# Patient Record
Sex: Female | Born: 1970 | Race: White | Hispanic: No | Marital: Married | Smoking: Never smoker
Health system: Southern US, Community
[De-identification: ages and names within clinical notes are randomized; demographics above are authoritative.]

## PROBLEM LIST (undated history)

## (undated) DIAGNOSIS — N200 Calculus of kidney: Secondary | ICD-10-CM

## (undated) DIAGNOSIS — T8859XA Other complications of anesthesia, initial encounter: Secondary | ICD-10-CM

## (undated) DIAGNOSIS — Z8719 Personal history of other diseases of the digestive system: Secondary | ICD-10-CM

## (undated) DIAGNOSIS — K219 Gastro-esophageal reflux disease without esophagitis: Secondary | ICD-10-CM

## (undated) DIAGNOSIS — Z8616 Personal history of COVID-19: Secondary | ICD-10-CM

## (undated) DIAGNOSIS — Z973 Presence of spectacles and contact lenses: Secondary | ICD-10-CM

## (undated) HISTORY — PX: OTHER SURGICAL HISTORY: SHX169

---
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
## 1999-08-19 ENCOUNTER — Encounter: Payer: Self-pay | Admitting: Obstetrics and Gynecology

## 1999-08-19 ENCOUNTER — Ambulatory Visit (HOSPITAL_COMMUNITY): Admission: RE | Admit: 1999-08-19 | Discharge: 1999-08-19 | Payer: Self-pay | Admitting: Obstetrics and Gynecology

## 1999-09-16 ENCOUNTER — Ambulatory Visit (HOSPITAL_COMMUNITY): Admission: RE | Admit: 1999-09-16 | Discharge: 1999-09-16 | Payer: Self-pay | Admitting: Obstetrics and Gynecology

## 1999-09-16 ENCOUNTER — Encounter: Payer: Self-pay | Admitting: Obstetrics and Gynecology

## 1999-10-24 ENCOUNTER — Inpatient Hospital Stay (HOSPITAL_COMMUNITY): Admission: AD | Admit: 1999-10-24 | Discharge: 1999-10-24 | Payer: Self-pay | Admitting: Obstetrics and Gynecology

## 1999-11-01 ENCOUNTER — Ambulatory Visit (HOSPITAL_COMMUNITY): Admission: RE | Admit: 1999-11-01 | Discharge: 1999-11-01 | Payer: Self-pay | Admitting: Obstetrics and Gynecology

## 2000-01-13 ENCOUNTER — Inpatient Hospital Stay (HOSPITAL_COMMUNITY): Admission: AD | Admit: 2000-01-13 | Discharge: 2000-01-15 | Payer: Self-pay | Admitting: Obstetrics and Gynecology

## 2000-03-24 HISTORY — PX: NASAL FRACTURE SURGERY: SHX718

## 2001-08-31 ENCOUNTER — Ambulatory Visit (HOSPITAL_BASED_OUTPATIENT_CLINIC_OR_DEPARTMENT_OTHER): Admission: RE | Admit: 2001-08-31 | Discharge: 2001-08-31 | Payer: Self-pay | Admitting: Otolaryngology

## 2002-03-24 HISTORY — PX: TUBAL LIGATION: SHX77

## 2002-05-16 ENCOUNTER — Ambulatory Visit (HOSPITAL_COMMUNITY): Admission: RE | Admit: 2002-05-16 | Discharge: 2002-05-16 | Payer: Self-pay | Admitting: Obstetrics and Gynecology

## 2002-05-16 ENCOUNTER — Encounter: Payer: Self-pay | Admitting: Obstetrics and Gynecology

## 2002-10-07 ENCOUNTER — Inpatient Hospital Stay (HOSPITAL_COMMUNITY): Admission: AD | Admit: 2002-10-07 | Discharge: 2002-10-09 | Payer: Self-pay | Admitting: Obstetrics and Gynecology

## 2003-05-11 ENCOUNTER — Other Ambulatory Visit: Admission: RE | Admit: 2003-05-11 | Discharge: 2003-05-11 | Payer: Self-pay | Admitting: Obstetrics and Gynecology

## 2004-05-13 ENCOUNTER — Other Ambulatory Visit: Admission: RE | Admit: 2004-05-13 | Discharge: 2004-05-13 | Payer: Self-pay | Admitting: Obstetrics and Gynecology

## 2004-05-31 ENCOUNTER — Ambulatory Visit (HOSPITAL_COMMUNITY): Admission: RE | Admit: 2004-05-31 | Discharge: 2004-05-31 | Payer: Self-pay | Admitting: Obstetrics and Gynecology

## 2005-02-06 ENCOUNTER — Ambulatory Visit: Payer: Self-pay | Admitting: Family Medicine

## 2006-01-14 ENCOUNTER — Ambulatory Visit: Payer: Self-pay | Admitting: Family Medicine

## 2006-03-24 HISTORY — PX: TONSILLECTOMY: SUR1361

## 2007-02-11 ENCOUNTER — Ambulatory Visit: Payer: Self-pay | Admitting: Otolaryngology

## 2010-07-09 ENCOUNTER — Other Ambulatory Visit: Payer: Self-pay | Admitting: Orthopedic Surgery

## 2010-07-09 DIAGNOSIS — R1011 Right upper quadrant pain: Secondary | ICD-10-CM

## 2010-07-17 ENCOUNTER — Ambulatory Visit
Admission: RE | Admit: 2010-07-17 | Discharge: 2010-07-17 | Disposition: A | Discharge status: Home / Self Care | Payer: Managed Care, Other (non HMO) | Source: Ambulatory Visit | Attending: Orthopedic Surgery | Admitting: Orthopedic Surgery

## 2010-07-17 DIAGNOSIS — R1011 Right upper quadrant pain: Secondary | ICD-10-CM

## 2010-07-23 ENCOUNTER — Other Ambulatory Visit (HOSPITAL_COMMUNITY): Payer: Self-pay | Admitting: Orthopedic Surgery

## 2010-07-23 DIAGNOSIS — R52 Pain, unspecified: Secondary | ICD-10-CM

## 2010-07-25 ENCOUNTER — Other Ambulatory Visit: Payer: Self-pay | Admitting: Orthopedic Surgery

## 2010-07-25 ENCOUNTER — Ambulatory Visit
Admission: RE | Admit: 2010-07-25 | Discharge: 2010-07-25 | Disposition: A | Discharge status: Home / Self Care | Payer: Managed Care, Other (non HMO) | Source: Ambulatory Visit | Attending: Orthopedic Surgery | Admitting: Orthopedic Surgery

## 2010-07-25 DIAGNOSIS — R52 Pain, unspecified: Secondary | ICD-10-CM

## 2010-07-29 ENCOUNTER — Encounter (HOSPITAL_COMMUNITY)
Admission: RE | Admit: 2010-07-29 | Discharge: 2010-07-29 | Disposition: A | Discharge status: Home / Self Care | Payer: Managed Care, Other (non HMO) | Source: Ambulatory Visit | Attending: Orthopedic Surgery | Admitting: Orthopedic Surgery

## 2010-07-29 DIAGNOSIS — R072 Precordial pain: Secondary | ICD-10-CM | POA: Insufficient documentation

## 2010-07-29 DIAGNOSIS — R52 Pain, unspecified: Secondary | ICD-10-CM

## 2010-07-29 DIAGNOSIS — R079 Chest pain, unspecified: Secondary | ICD-10-CM | POA: Insufficient documentation

## 2010-07-29 MED ORDER — TECHNETIUM TC 99M MEDRONATE IV KIT
25.0000 | PACK | Freq: Once | INTRAVENOUS | Status: AC | PRN
  Administered 2010-07-29: 25 via INTRAVENOUS

## 2010-07-31 ENCOUNTER — Other Ambulatory Visit: Payer: Self-pay | Admitting: Cardiothoracic Surgery

## 2010-07-31 ENCOUNTER — Encounter (INDEPENDENT_AMBULATORY_CARE_PROVIDER_SITE_OTHER): Payer: Managed Care, Other (non HMO) | Admitting: Cardiothoracic Surgery

## 2010-07-31 DIAGNOSIS — R079 Chest pain, unspecified: Secondary | ICD-10-CM

## 2010-07-31 DIAGNOSIS — R0781 Pleurodynia: Secondary | ICD-10-CM
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
## 2010-08-07 ENCOUNTER — Ambulatory Visit: Payer: Managed Care, Other (non HMO) | Admitting: Cardiothoracic Surgery

## 2010-08-07 ENCOUNTER — Other Ambulatory Visit: Payer: Managed Care, Other (non HMO)

## 2010-08-08 ENCOUNTER — Other Ambulatory Visit: Payer: Managed Care, Other (non HMO)

## 2010-08-08 ENCOUNTER — Ambulatory Visit (INDEPENDENT_AMBULATORY_CARE_PROVIDER_SITE_OTHER): Payer: Managed Care, Other (non HMO) | Admitting: Cardiothoracic Surgery

## 2010-08-08 DIAGNOSIS — R079 Chest pain, unspecified: Secondary | ICD-10-CM

## 2010-08-09 NOTE — Assessment & Plan Note (Signed)
OFFICE VISIT  Theresa Solis, Theresa Solis DOB:  10/07/1970                                        Aug 08, 2010 CHART #:  [id number]  CURRENT PROBLEMS:  The patient returns for followup discussion of her right lower costochondral margin pain, swelling, tenderness, and dysesthesia.  A CT scan was initially planned for today, but was not approved by her insurance company [employer]).  She is still having pain in that area related to her position.  This sounds like neuralgia similar to post thoracotomy pain.  She has had problems with her back over the years and has been treated by a chiropractor.  She states that there is a point in her thoracic spine approximately at T5 or T6 that is manipulated intermittently.  It is the proximal T6 dermatome, which is the location of her symptoms.  For that reason, we will proceed with MRI of the thoracic spine and thorax to look for structural or nerve root etiology of her pain syndrome.  We discussed using Lyrica for treatment of her symptoms, but she is not comfortable starting the medication without full diagnostic evaluation first.  On exam today, her blood pressure 120/80, pulse is 98, saturation 99%. Her breath sounds are clear.  She has no palpable deformities in her ribs on the right, and there is no palpable abnormality in the area of her symptoms.  PLAN:  She will return with results of T-spine - thoracic MRI.  Kerin Perna, M.D. Electronically Signed  PV/MEDQ  D:  08/08/2010  T:  08/09/2010  Job:  [id number]  cc:   G. Dorene Grebe, M.D.

## 2010-08-20 ENCOUNTER — Other Ambulatory Visit: Payer: Self-pay | Admitting: Cardiothoracic Surgery

## 2010-08-20 DIAGNOSIS — R0781 Pleurodynia: Secondary | ICD-10-CM

## 2010-08-23 ENCOUNTER — Ambulatory Visit
Admission: RE | Admit: 2010-08-23 | Discharge: 2010-08-23 | Disposition: A | Discharge status: Home / Self Care | Payer: Managed Care, Other (non HMO) | Source: Ambulatory Visit | Attending: Cardiothoracic Surgery | Admitting: Cardiothoracic Surgery

## 2010-08-23 ENCOUNTER — Ambulatory Visit (INDEPENDENT_AMBULATORY_CARE_PROVIDER_SITE_OTHER): Payer: Managed Care, Other (non HMO) | Admitting: Cardiothoracic Surgery

## 2010-08-23 DIAGNOSIS — R0781 Pleurodynia: Secondary | ICD-10-CM

## 2010-08-23 DIAGNOSIS — G548 Other nerve root and plexus disorders: Secondary | ICD-10-CM

## 2010-08-23 MED ORDER — GADOBENATE DIMEGLUMINE 529 MG/ML IV SOLN
10.0000 mL | Freq: Once | INTRAVENOUS | Status: AC | PRN
  Administered 2010-08-23: 10 mL via INTRAVENOUS

## 2010-08-25 NOTE — Assessment & Plan Note (Signed)
OFFICE VISIT  TALENA, NEIRA DOB:  20-Feb-1971                                        August 25, 2010 CHART #:  [id number]  CURRENT PROBLEMS: 1. Right lower costochondral junction swelling and pain and     dysesthesia. 2. Negative MRI of the abdomen, negative bone scan. 3. No chronic medications or drug allergies.  PRESENT ILLNESS:  Ms. Karam is a 40 year old nonsmoker returns for followup evaluation of her above noted symptoms.  Since her last visit, she underwent a MRI of her thoracic spine due to symptoms of dysesthesia in the T6-T7 dermatome similar to post thoracotomy type symptoms.  The MRI of her thoracic spine with and without contrast shows no abnormal costochondral junctions, no abnormalities of the thoracic spine, and no evidence of thoracic spinal, neuroforaminal stenosis.  The thoracic intervertebral disk morphology is normal as well.  PHYSICAL EXAMINATION:  VITAL SIGNS:  Blood pressure is 99/60, pulse 83, respirations 16, saturation 98%. LUNGS:  Breath sounds are clear. CARDIAC:  Rhythm is regular and she has no palpable abnormalities of her thorax.  RECOMMENDATIONS:  Discussed empiric medication such as Neurontin or Cymbalta for possible fibromyalgia.  She does not wish to start medication as these symptoms are only intermittent and short lasting. We discussed potential referral to a pain management physician Dr. Wynn Banker but she again is not willing to entertain injections or topical Lidoderm therapy for intermittent short-acting symptoms.  We also discussed the possibility of a GI evaluation by gastroenterologist which would be up to the discretion of her primary physician.  Otherwise she will return here as needed.  Kerin Perna, M.D. Electronically Signed  PV/MEDQ  D:  08/25/2010  T:  08/25/2010  Job:  [id number]

## 2012-03-30 ENCOUNTER — Other Ambulatory Visit: Payer: Self-pay | Admitting: Dermatology

## 2012-06-08 ENCOUNTER — Other Ambulatory Visit: Payer: Self-pay

## 2012-07-28 ENCOUNTER — Ambulatory Visit
Admission: RE | Admit: 2012-07-28 | Discharge: 2012-07-28 | Disposition: A | Discharge status: Home / Self Care | Payer: Managed Care, Other (non HMO) | Source: Ambulatory Visit

## 2012-07-28 DIAGNOSIS — Z1231 Encounter for screening mammogram for malignant neoplasm of breast: Secondary | ICD-10-CM

## 2012-08-10 ENCOUNTER — Other Ambulatory Visit: Payer: Self-pay | Admitting: Gastroenterology

## 2012-08-10 DIAGNOSIS — R1011 Right upper quadrant pain: Secondary | ICD-10-CM

## 2012-08-17 ENCOUNTER — Ambulatory Visit
Admission: RE | Admit: 2012-08-17 | Discharge: 2012-08-17 | Disposition: A | Discharge status: Home / Self Care | Payer: Managed Care, Other (non HMO) | Source: Ambulatory Visit | Attending: Gastroenterology | Admitting: Gastroenterology

## 2012-08-17 DIAGNOSIS — R1011 Right upper quadrant pain: Secondary | ICD-10-CM

## 2014-04-12 ENCOUNTER — Other Ambulatory Visit: Payer: Self-pay | Admitting: Internal Medicine

## 2014-04-12 DIAGNOSIS — K2100 Gastro-esophageal reflux disease with esophagitis, without bleeding: Secondary | ICD-10-CM

## 2014-04-13 ENCOUNTER — Ambulatory Visit
Admission: RE | Admit: 2014-04-13 | Discharge: 2014-04-13 | Disposition: A | Discharge status: Home / Self Care | Payer: Managed Care, Other (non HMO) | Source: Ambulatory Visit | Attending: Internal Medicine | Admitting: Internal Medicine

## 2014-04-13 DIAGNOSIS — K2100 Gastro-esophageal reflux disease with esophagitis, without bleeding: Secondary | ICD-10-CM

## 2014-06-04 ENCOUNTER — Encounter (HOSPITAL_COMMUNITY): Payer: Self-pay | Admitting: *Deleted

## 2014-06-04 ENCOUNTER — Emergency Department (HOSPITAL_COMMUNITY): Payer: Managed Care, Other (non HMO)

## 2014-06-04 ENCOUNTER — Emergency Department (HOSPITAL_COMMUNITY)
Admission: EM | Admit: 2014-06-04 | Discharge: 2014-06-05 | Disposition: A | Discharge status: Home / Self Care | Payer: Managed Care, Other (non HMO) | Attending: Emergency Medicine | Admitting: Emergency Medicine

## 2014-06-04 DIAGNOSIS — N201 Calculus of ureter: Secondary | ICD-10-CM | POA: Diagnosis not present

## 2014-06-04 DIAGNOSIS — Z3202 Encounter for pregnancy test, result negative: Secondary | ICD-10-CM | POA: Insufficient documentation

## 2014-06-04 DIAGNOSIS — R1031 Right lower quadrant pain: Secondary | ICD-10-CM | POA: Diagnosis present

## 2014-06-04 DIAGNOSIS — R109 Unspecified abdominal pain: Secondary | ICD-10-CM

## 2014-06-04 LAB — CBC WITH DIFFERENTIAL/PLATELET
BASOS ABS: 0.1 10*3/uL (ref 0.0–0.1)
Basophils Relative: 1 % (ref 0–1)
Eosinophils Absolute: 0.1 10*3/uL (ref 0.0–0.7)
Eosinophils Relative: 2 % (ref 0–5)
HCT: 35.8 % — ABNORMAL LOW (ref 36.0–46.0)
HEMOGLOBIN: 11.7 g/dL — AB (ref 12.0–15.0)
Lymphocytes Relative: 23 % (ref 12–46)
Lymphs Abs: 1.9 10*3/uL (ref 0.7–4.0)
MCH: 27.5 pg (ref 26.0–34.0)
MCHC: 32.7 g/dL (ref 30.0–36.0)
MCV: 84 fL (ref 78.0–100.0)
MONO ABS: 0.8 10*3/uL (ref 0.1–1.0)
Monocytes Relative: 9 % (ref 3–12)
NEUTROS PCT: 65 % (ref 43–77)
Neutro Abs: 5.5 10*3/uL (ref 1.7–7.7)
PLATELETS: 243 10*3/uL (ref 150–400)
RBC: 4.26 MIL/uL (ref 3.87–5.11)
RDW: 14 % (ref 11.5–15.5)
WBC: 8.5 10*3/uL (ref 4.0–10.5)

## 2014-06-04 LAB — URINALYSIS, ROUTINE W REFLEX MICROSCOPIC
BILIRUBIN URINE: NEGATIVE
Glucose, UA: NEGATIVE mg/dL
Ketones, ur: NEGATIVE mg/dL
LEUKOCYTES UA: NEGATIVE
NITRITE: NEGATIVE
PROTEIN: NEGATIVE mg/dL
SPECIFIC GRAVITY, URINE: 1.017 (ref 1.005–1.030)
UROBILINOGEN UA: 0.2 mg/dL (ref 0.0–1.0)
pH: 6 (ref 5.0–8.0)

## 2014-06-04 LAB — COMPREHENSIVE METABOLIC PANEL
ALBUMIN: 4 g/dL (ref 3.5–5.2)
ALK PHOS: 60 U/L (ref 39–117)
ALT: 10 U/L (ref 0–35)
AST: 18 U/L (ref 0–37)
Anion gap: 8 (ref 5–15)
BILIRUBIN TOTAL: 0.4 mg/dL (ref 0.3–1.2)
BUN: 9 mg/dL (ref 6–23)
CALCIUM: 9.5 mg/dL (ref 8.4–10.5)
CO2: 26 mmol/L (ref 19–32)
Chloride: 105 mmol/L (ref 96–112)
Creatinine, Ser: 0.82 mg/dL (ref 0.50–1.10)
GFR, EST NON AFRICAN AMERICAN: 88 mL/min — AB (ref >90)
GLUCOSE: 100 mg/dL — AB (ref 70–99)
POTASSIUM: 4.3 mmol/L (ref 3.5–5.1)
SODIUM: 139 mmol/L (ref 135–145)
Total Protein: 6.9 g/dL (ref 6.0–8.3)

## 2014-06-04 LAB — PREGNANCY, URINE: Preg Test, Ur: NEGATIVE

## 2014-06-04 LAB — URINE MICROSCOPIC-ADD ON

## 2014-06-04 LAB — LIPASE, BLOOD: LIPASE: 25 U/L (ref 11–59)

## 2014-06-04 MED ORDER — ONDANSETRON 4 MG PO TBDP
8.0000 mg | ORAL_TABLET | Freq: Once | ORAL | Status: AC
  Administered 2014-06-04: 8 mg via ORAL
  Filled 2014-06-04: qty 2

## 2014-06-04 MED ORDER — HYDROMORPHONE HCL 1 MG/ML IJ SOLN
1.0000 mg | Freq: Once | INTRAMUSCULAR | Status: AC
  Administered 2014-06-04: 1 mg via INTRAVENOUS
  Filled 2014-06-04: qty 1

## 2014-06-04 MED ORDER — OXYCODONE-ACETAMINOPHEN 5-325 MG PO TABS
1.0000 | ORAL_TABLET | Freq: Once | ORAL | Status: AC
  Administered 2014-06-04: 1 via ORAL
  Filled 2014-06-04: qty 1

## 2014-06-04 MED ORDER — ONDANSETRON HCL 4 MG/2ML IJ SOLN
4.0000 mg | Freq: Once | INTRAMUSCULAR | Status: AC
  Administered 2014-06-04: 4 mg via INTRAVENOUS
  Filled 2014-06-04: qty 2

## 2014-06-04 NOTE — ED Notes (Signed)
The pt is c/o rt l q pain for 1-2 hours with nausea.  lmp  25th feb

## 2014-06-04 NOTE — ED Provider Notes (Signed)
CSN: [id number]     Arrival date & time 06/04/14  1858 History   First MD Initiated Contact with Patient 06/04/14 2236     Chief Complaint  Patient presents with  . Abdominal Pain     (Consider location/radiation/quality/duration/timing/severity/associated sxs/prior Treatment) HPI   The patient is a 44 y/o who presents to the emergency department with right lower quadrant abdominal pain.  Patient states that started suddenly this evening and was accompanied by nausea but no vomiting.  Patient denies diarrhea, chest pain, shortness of breath, weakness, dizziness, headache, blurred vision, dysuria, hematuria, fever or syncope.  The patient states that she did not take any medications prior to arrival.  Nothing seems make her condition better and palpation makes the pain worse  History reviewed. No pertinent past medical history. History reviewed. No pertinent past surgical history. No family history on file. History  Substance Use Topics  . Smoking status: Never Smoker   . Smokeless tobacco: Not on file  . Alcohol Use: Not on file   OB History    No data available     Review of Systems  All other systems negative except as documented in the HPI. All pertinent positives and negatives as reviewed in the HPI.   Allergies  Codeine and Tetracyclines & related  Home Medications   Prior to Admission medications   Not on File   BP 112/80 mmHg  Pulse 88  Temp(Src) 97.7 F (36.5 C)  Resp 18  Ht 5' 8"  (1.727 m)  Wt 150 lb (68.04 kg)  BMI 22.81 kg/m2  SpO2 99%  LMP 05/21/2014 Physical Exam  Constitutional: She is oriented to person, place, and time. She appears well-developed and well-nourished. No distress.  HENT:  Head: Normocephalic and atraumatic.  Mouth/Throat: Oropharynx is clear and moist.  Eyes: Pupils are equal, round, and reactive to light.  Neck: Normal range of motion. Neck supple.  Cardiovascular: Normal rate, regular rhythm and normal heart sounds.  Exam  reveals no gallop and no friction rub.   No murmur heard. Pulmonary/Chest: Effort normal and breath sounds normal. No respiratory distress.  Abdominal: Soft. Normal appearance and bowel sounds are normal. She exhibits no distension. There is tenderness. There is no rigidity, no rebound and no guarding.    Neurological: She is alert and oriented to person, place, and time. She exhibits normal muscle tone. Coordination normal.  Skin: Skin is warm and dry.  Nursing note and vitals reviewed.   ED Course  Procedures (including critical care time) Labs Review Labs Reviewed  CBC WITH DIFFERENTIAL/PLATELET - Abnormal; Notable for the following:    Hemoglobin 11.7 (*)    HCT 35.8 (*)    All other components within normal limits  COMPREHENSIVE METABOLIC PANEL - Abnormal; Notable for the following:    Glucose, Bld 100 (*)    GFR calc non Af Amer 88 (*)    All other components within normal limits  URINALYSIS, ROUTINE W REFLEX MICROSCOPIC - Abnormal; Notable for the following:    Hgb urine dipstick MODERATE (*)    All other components within normal limits  URINE MICROSCOPIC-ADD ON - Abnormal; Notable for the following:    Squamous Epithelial / LPF MANY (*)    Casts GRANULAR CAST (*)    All other components within normal limits  LIPASE, BLOOD  PREGNANCY, URINE    Imaging Review Ct Renal Stone Study  06/04/2014   CLINICAL DATA:  Right flank pain, nausea and vomiting  EXAM: CT ABDOMEN AND PELVIS  WITHOUT CONTRAST  TECHNIQUE: Multidetector CT imaging of the abdomen and pelvis was performed following the standard protocol without IV contrast.  COMPARISON:  None.  FINDINGS: There is an obstructing 2.5 mm right ureteral calculus at the upper L4 level with proximal ureterectasis and hydronephrosis. Collecting system calculi are present bilaterally, measuring up to 3 mm in the right upper pole and 4 mm in the left upper pole.  No other acute findings are evident in the abdomen or pelvis. There are  otherwise unremarkable unenhanced appearances of the abdomen and pelvis. No significant abnormality is evident in the lower chest. No significant musculoskeletal abnormality is evident.  IMPRESSION: Obstructing 2.5 mm right ureteral calculus at the upper L4 level with moderate hydronephrosis. Bilateral nephrolithiasis.   Electronically Signed   By: Andreas Newport M.D.   On: 06/04/2014 23:45    Patient has a ureteral stone.  She will be given pain control and advised follow-up with urology.  Patient is feeling improved following pain medications.  Told to increase her fluid intake and rest as much as possible.  Told to return here for worsening in her condition  MDM   Final diagnoses:  RLQ abdominal pain  Abdominal pain        Dalia Heading, PA-C 06/05/14 8682  Serita Grit, MD 06/08/14 [phone]

## 2014-06-05 MED ORDER — PROMETHAZINE HCL 25 MG PO TABS: 25.0000 mg | ORAL_TABLET | Freq: Three times a day (TID) | ORAL | Status: DC | PRN

## 2014-06-05 MED ORDER — KETOROLAC TROMETHAMINE 30 MG/ML IJ SOLN
30.0000 mg | Freq: Once | INTRAMUSCULAR | Status: AC
  Administered 2014-06-05: 30 mg via INTRAVENOUS
  Filled 2014-06-05: qty 1

## 2014-06-05 MED ORDER — HYDROMORPHONE HCL 1 MG/ML IJ SOLN
1.0000 mg | Freq: Once | INTRAMUSCULAR | Status: AC
  Administered 2014-06-05: 1 mg via INTRAVENOUS
  Filled 2014-06-05: qty 1

## 2014-06-05 MED ORDER — OXYCODONE-ACETAMINOPHEN 5-325 MG PO TABS: 1.0000 | ORAL_TABLET | Freq: Four times a day (QID) | ORAL | Status: DC | PRN

## 2014-06-05 NOTE — Discharge Instructions (Signed)
Return here for any worsening in your condition.  Follow-up with the urologist provided.  Increase your fluid intake

## 2014-06-06 ENCOUNTER — Encounter (HOSPITAL_BASED_OUTPATIENT_CLINIC_OR_DEPARTMENT_OTHER): Payer: Self-pay | Admitting: *Deleted

## 2014-06-06 ENCOUNTER — Other Ambulatory Visit: Payer: Self-pay | Admitting: Urology

## 2014-06-06 NOTE — Progress Notes (Signed)
NPO AFTER MN WITH EXCEPTION CLEAR LIQUIDS UNTIL 0800 (NO CREAM/ MILK PRODUCTS).  ARRIVE AT 1230. CURRENT LAB RESULTS IN CHART AND EPIC. WILL TAKE FLOMAX AND COLACE AM DOS W/ SIPS OF WATER AND IF NEEDED MAY TAKE PERCOCET/ PHENERGAN.

## 2014-06-07 NOTE — Anesthesia Preprocedure Evaluation (Addendum)
Anesthesia Evaluation  Patient identified by MRN, date of birth, ID band Patient awake    Reviewed: Allergy & Precautions, NPO status , Patient's Chart, lab work & pertinent test results  History of Anesthesia Complications Negative for: history of anesthetic complications  Airway Mallampati: II  TM Distance: >3 FB Neck ROM: Full    Dental no notable dental hx. (+) Dental Advisory Given   Pulmonary neg pulmonary ROS,  breath sounds clear to auscultation  Pulmonary exam normal       Cardiovascular negative cardio ROS  Rhythm:Regular Rate:Normal     Neuro/Psych negative neurological ROS  negative psych ROS   GI/Hepatic Neg liver ROS, GERD-  ,  Endo/Other  negative endocrine ROS  Renal/GU Renal disease  negative genitourinary   Musculoskeletal negative musculoskeletal ROS (+)   Abdominal   Peds negative pediatric ROS (+)  Hematology  (+) anemia ,   Anesthesia Other Findings   Reproductive/Obstetrics negative OB ROS                             Anesthesia Physical Anesthesia Plan  ASA: II  Anesthesia Plan: General   Post-op Pain Management:    Induction: Intravenous  Airway Management Planned: LMA  Additional Equipment:   Intra-op Plan:   Post-operative Plan: Extubation in OR  Informed Consent: I have reviewed the patients History and Physical, chart, labs and discussed the procedure including the risks, benefits and alternatives for the proposed anesthesia with the patient or authorized representative who has indicated his/her understanding and acceptance.   Dental advisory given  Plan Discussed with: CRNA  Anesthesia Plan Comments:         Anesthesia Quick Evaluation

## 2014-06-08 ENCOUNTER — Ambulatory Visit (HOSPITAL_BASED_OUTPATIENT_CLINIC_OR_DEPARTMENT_OTHER): Payer: Managed Care, Other (non HMO) | Admitting: Anesthesiology

## 2014-06-08 ENCOUNTER — Ambulatory Visit (HOSPITAL_BASED_OUTPATIENT_CLINIC_OR_DEPARTMENT_OTHER)
Admission: RE | Admit: 2014-06-08 | Discharge: 2014-06-08 | Disposition: A | Discharge status: Home / Self Care | Payer: Managed Care, Other (non HMO) | Source: Ambulatory Visit | Attending: Urology | Admitting: Urology

## 2014-06-08 ENCOUNTER — Encounter (HOSPITAL_BASED_OUTPATIENT_CLINIC_OR_DEPARTMENT_OTHER)
Admission: RE | Disposition: A | Discharge status: Home / Self Care | Payer: Self-pay | Source: Ambulatory Visit | Attending: Urology

## 2014-06-08 ENCOUNTER — Encounter (HOSPITAL_BASED_OUTPATIENT_CLINIC_OR_DEPARTMENT_OTHER): Payer: Self-pay

## 2014-06-08 DIAGNOSIS — D649 Anemia, unspecified: Secondary | ICD-10-CM | POA: Diagnosis not present

## 2014-06-08 DIAGNOSIS — N200 Calculus of kidney: Secondary | ICD-10-CM | POA: Diagnosis present

## 2014-06-08 DIAGNOSIS — K219 Gastro-esophageal reflux disease without esophagitis: Secondary | ICD-10-CM | POA: Insufficient documentation

## 2014-06-08 DIAGNOSIS — Z886 Allergy status to analgesic agent status: Secondary | ICD-10-CM | POA: Diagnosis not present

## 2014-06-08 DIAGNOSIS — Z79899 Other long term (current) drug therapy: Secondary | ICD-10-CM | POA: Diagnosis not present

## 2014-06-08 DIAGNOSIS — Z9851 Tubal ligation status: Secondary | ICD-10-CM | POA: Insufficient documentation

## 2014-06-08 HISTORY — DX: Presence of spectacles and contact lenses: Z97.3

## 2014-06-08 HISTORY — PX: CYSTOSCOPY WITH RETROGRADE PYELOGRAM, URETEROSCOPY AND STENT PLACEMENT: SHX5789

## 2014-06-08 HISTORY — PX: HOLMIUM LASER APPLICATION: SHX5852

## 2014-06-08 HISTORY — DX: Calculus of kidney: N20.0

## 2014-06-08 HISTORY — DX: Personal history of other diseases of the digestive system: Z87.19

## 2014-06-08 LAB — POCT I-STAT, CHEM 8
BUN: 7 mg/dL (ref 6–23)
CHLORIDE: 105 mmol/L (ref 96–112)
Calcium, Ion: 1.25 mmol/L — ABNORMAL HIGH (ref 1.12–1.23)
Creatinine, Ser: 0.7 mg/dL (ref 0.50–1.10)
GLUCOSE: 94 mg/dL (ref 70–99)
HCT: 37 % (ref 36.0–46.0)
Hemoglobin: 12.6 g/dL (ref 12.0–15.0)
POTASSIUM: 3.9 mmol/L (ref 3.5–5.1)
SODIUM: 143 mmol/L (ref 135–145)
TCO2: 24 mmol/L (ref 0–100)

## 2014-06-08 SURGERY — CYSTOURETEROSCOPY, WITH RETROGRADE PYELOGRAM AND STENT INSERTION
Anesthesia: General | Site: Ureter | Laterality: Left

## 2014-06-08 MED ORDER — SENNOSIDES-DOCUSATE SODIUM 8.6-50 MG PO TABS: 1.0000 | ORAL_TABLET | Freq: Two times a day (BID) | ORAL | Status: DC

## 2014-06-08 MED ORDER — SODIUM CHLORIDE 0.9 % IR SOLN
Status: DC | PRN
  Administered 2014-06-08: 6500 mL

## 2014-06-08 MED ORDER — GENTAMICIN IN SALINE 1.6-0.9 MG/ML-% IV SOLN
80.0000 mg | INTRAVENOUS | Status: DC
  Filled 2014-06-08: qty 50

## 2014-06-08 MED ORDER — GENTAMICIN SULFATE 40 MG/ML IJ SOLN
5.0000 mg/kg | Freq: Once | INTRAVENOUS | Status: AC
  Administered 2014-06-08: 330 mg via INTRAVENOUS
  Filled 2014-06-08: qty 8.25

## 2014-06-08 MED ORDER — OXYCODONE HCL 5 MG PO TABS
5.0000 mg | ORAL_TABLET | Freq: Once | ORAL | Status: AC
  Administered 2014-06-08: 5 mg via ORAL
  Filled 2014-06-08: qty 1

## 2014-06-08 MED ORDER — FENTANYL CITRATE 0.05 MG/ML IJ SOLN
INTRAMUSCULAR | Status: DC | PRN
  Administered 2014-06-08: 25 ug via INTRAVENOUS
  Administered 2014-06-08: 50 ug via INTRAVENOUS
  Administered 2014-06-08: 25 ug via INTRAVENOUS
  Administered 2014-06-08: 50 ug via INTRAVENOUS

## 2014-06-08 MED ORDER — ACETAMINOPHEN 10 MG/ML IV SOLN
INTRAVENOUS | Status: DC | PRN
  Administered 2014-06-08: 1000 mg via INTRAVENOUS

## 2014-06-08 MED ORDER — METOCLOPRAMIDE HCL 5 MG/ML IJ SOLN
INTRAMUSCULAR | Status: DC | PRN
  Administered 2014-06-08: 10 mg via INTRAVENOUS

## 2014-06-08 MED ORDER — LIDOCAINE HCL (CARDIAC) 20 MG/ML IV SOLN
INTRAVENOUS | Status: DC | PRN
  Administered 2014-06-08: 100 mg via INTRAVENOUS

## 2014-06-08 MED ORDER — OXYCODONE-ACETAMINOPHEN 5-325 MG PO TABS: 1.0000 | ORAL_TABLET | Freq: Four times a day (QID) | ORAL | Status: DC | PRN

## 2014-06-08 MED ORDER — IOHEXOL 350 MG/ML SOLN
INTRAVENOUS | Status: DC | PRN
  Administered 2014-06-08: 18 mL

## 2014-06-08 MED ORDER — MIDAZOLAM HCL 2 MG/2ML IJ SOLN
INTRAMUSCULAR | Status: AC
  Filled 2014-06-08: qty 2

## 2014-06-08 MED ORDER — FENTANYL CITRATE 0.05 MG/ML IJ SOLN
25.0000 ug | INTRAMUSCULAR | Status: DC | PRN
  Administered 2014-06-08 (×2): 25 ug via INTRAVENOUS
  Filled 2014-06-08: qty 1

## 2014-06-08 MED ORDER — OXYCODONE HCL 5 MG PO TABS
ORAL_TABLET | ORAL | Status: AC
  Filled 2014-06-08: qty 1

## 2014-06-08 MED ORDER — LACTATED RINGERS IV SOLN
INTRAVENOUS | Status: DC
  Administered 2014-06-08 (×2): via INTRAVENOUS
  Filled 2014-06-08: qty 1000

## 2014-06-08 MED ORDER — MIDAZOLAM HCL 5 MG/5ML IJ SOLN
INTRAMUSCULAR | Status: DC | PRN
  Administered 2014-06-08: 2 mg via INTRAVENOUS

## 2014-06-08 MED ORDER — ONDANSETRON HCL 4 MG/2ML IJ SOLN
4.0000 mg | Freq: Once | INTRAMUSCULAR | Status: DC | PRN
  Filled 2014-06-08: qty 2

## 2014-06-08 MED ORDER — CEPHALEXIN 500 MG PO CAPS: 500.0000 mg | ORAL_CAPSULE | Freq: Two times a day (BID) | ORAL | Status: DC

## 2014-06-08 MED ORDER — DEXAMETHASONE SODIUM PHOSPHATE 4 MG/ML IJ SOLN
INTRAMUSCULAR | Status: DC | PRN
  Administered 2014-06-08: 10 mg via INTRAVENOUS

## 2014-06-08 MED ORDER — PROPOFOL 10 MG/ML IV BOLUS
INTRAVENOUS | Status: DC | PRN
  Administered 2014-06-08: 200 mg via INTRAVENOUS

## 2014-06-08 MED ORDER — KETOROLAC TROMETHAMINE 30 MG/ML IJ SOLN
INTRAMUSCULAR | Status: DC | PRN
  Administered 2014-06-08: 30 mg via INTRAVENOUS

## 2014-06-08 MED ORDER — FENTANYL CITRATE 0.05 MG/ML IJ SOLN
INTRAMUSCULAR | Status: AC
  Filled 2014-06-08: qty 4

## 2014-06-08 MED ORDER — ONDANSETRON HCL 4 MG/2ML IJ SOLN
INTRAMUSCULAR | Status: DC | PRN
  Administered 2014-06-08: 4 mg via INTRAVENOUS

## 2014-06-08 MED ORDER — FENTANYL CITRATE 0.05 MG/ML IJ SOLN
INTRAMUSCULAR | Status: AC
  Filled 2014-06-08: qty 2

## 2014-06-08 SURGICAL SUPPLY — 25 items
ADAPTER CATH URET PLST 4-6FR (CATHETERS) ×4 IMPLANT
BAG DRAIN URO-CYSTO SKYTR STRL (DRAIN) ×4 IMPLANT
BASKET LASER NITINOL 1.9FR (BASKET) ×4 IMPLANT
CANISTER SUCT LVC 12 LTR MEDI- (MISCELLANEOUS) ×4 IMPLANT
CATH INTERMIT  6FR 70CM (CATHETERS) ×4 IMPLANT
CLOTH BEACON ORANGE TIMEOUT ST (SAFETY) ×4 IMPLANT
FIBER LASER FLEXIVA 200 (UROLOGICAL SUPPLIES) IMPLANT
FIBER LASER FLEXIVA 365 (UROLOGICAL SUPPLIES) IMPLANT
FIBER LASER TRAC TIP (UROLOGICAL SUPPLIES) ×4 IMPLANT
GLOVE BIO SURGEON STRL SZ 6.5 (GLOVE) ×3 IMPLANT
GLOVE BIO SURGEON STRL SZ7.5 (GLOVE) ×4 IMPLANT
GLOVE BIO SURGEONS STRL SZ 6.5 (GLOVE) ×1
GLOVE BIOGEL PI IND STRL 6.5 (GLOVE) ×4 IMPLANT
GLOVE BIOGEL PI INDICATOR 6.5 (GLOVE) ×4
GOWN STRL REUS W/ TWL XL LVL3 (GOWN DISPOSABLE) ×2 IMPLANT
GOWN STRL REUS W/TWL XL LVL3 (GOWN DISPOSABLE) ×6 IMPLANT
GUIDEWIRE ANG ZIPWIRE 038X150 (WIRE) ×8 IMPLANT
GUIDEWIRE STR DUAL SENSOR (WIRE) ×4 IMPLANT
IV NS IRRIG 3000ML ARTHROMATIC (IV SOLUTION) ×8 IMPLANT
NS IRRIG 500ML POUR BTL (IV SOLUTION) ×4 IMPLANT
PACK CYSTO (CUSTOM PROCEDURE TRAY) ×4 IMPLANT
SHEATH ACCESS URETERAL 24CM (SHEATH) ×4 IMPLANT
STENT POLARIS 5FRX24 (STENTS) ×4 IMPLANT
SYRINGE 10CC LL (SYRINGE) ×4 IMPLANT
TUBE FEEDING 8FR 16IN STR KANG (MISCELLANEOUS) ×4 IMPLANT

## 2014-06-08 NOTE — Anesthesia Postprocedure Evaluation (Signed)
  Anesthesia Post-op Note  Patient: Theresa Solis  Procedure(s) Performed: Procedure(s) (LRB): CYSTOSCOPY WITH BILATERAL RETROGRADE PYELOGRAM/ BILATERAL URETERAL STENT PLACEMENT/ BILATERAL URETEROSCOPY, LASER LITHOTRIPSY/STONE BASKETRY (Bilateral) HOLMIUM LASER APPLICATION (Left)  Patient Location: PACU  Anesthesia Type: General  Level of Consciousness: awake and alert   Airway and Oxygen Therapy: Patient Spontanous Breathing  Post-op Pain: mild  Post-op Assessment: Post-op Vital signs reviewed, Patient's Cardiovascular Status Stable, Respiratory Function Stable, Patent Airway and No signs of Nausea or vomiting  Last Vitals:  Filed Vitals:   06/08/14 1449  BP: 110/66  Pulse: 94  Temp: 36.5 C  Resp: 17    Post-op Vital Signs: stable   Complications: No apparent anesthesia complications

## 2014-06-08 NOTE — Anesthesia Procedure Notes (Signed)
Procedure Name: LMA Insertion Date/Time: 06/08/2014 1:48 PM Performed by: Mechele Claude Pre-anesthesia Checklist: Patient identified, Emergency Drugs available, Suction available and Patient being monitored Patient Re-evaluated:Patient Re-evaluated prior to inductionOxygen Delivery Method: Circle System Utilized Preoxygenation: Pre-oxygenation with 100% oxygen Intubation Type: IV induction Ventilation: Mask ventilation without difficulty LMA: LMA inserted LMA Size: 4.0 Number of attempts: 1 Airway Equipment and Method: bite block Placement Confirmation: positive ETCO2 Tube secured with: Tape Dental Injury: Teeth and Oropharynx as per pre-operative assessment

## 2014-06-08 NOTE — Discharge Instructions (Signed)
1 - You may have urinary urgency (bladder spasms) and bloody urine on / off with stent in place. This is normal.  2 - Call MD or go to ER for fever >102, severe pain / nausea / vomiting not relieved by medications, or acute change in medical status  3 - Remove tethered stent on Monday morning at home by pulling on string, then blue/white plastic tubing and discarding. There are two stents. Dr. Tresa Moore is in the office Monday if any issues arise.    [facility] [phone] Post Ureteroscopy With or Without Stent Instructions  Definitions:  Ureter: The duct that transports urine from the kidney to the bladder. Stent:   A plastic hollow tube that is placed into the ureter, from the kidney to the                 bladder to prevent the ureter from swelling shut.  GENERAL INSTRUCTIONS:  Despite the fact that no skin incisions were used, the area around the ureter and bladder is raw and irritated. The stent is a foreign body which will further irritate the bladder wall. This irritation is manifested by increased frequency of urination, both day and night, and by an increase in the urge to urinate. In some, the urge to urinate is present almost always. Sometimes the urge is strong enough that you may not be able to stop yourself from urinating. The only real cure is to remove the stent and then give time for the bladder wall to heal which can't be done until the danger of the ureter swelling shut has passed, which varies.  You may see some blood in your urine while the stent is in place and a few days afterwards. Do not be alarmed, even if the urine was clear for a while. Get off your feet and drink lots of fluids until clearing occurs. If you start to pass clots or don't improve, call us.  DIET: You may return to your normal diet immediately. Because of the raw surface of your bladder, alcohol, spicy foods, acid type foods and drinks with caffeine may cause irritation or frequency and  should be used in moderation. To keep your urine flowing freely and to avoid constipation, drink plenty of fluids during the day ( 8-10 glasses ). Tip: Avoid cranberry juice because it is very acidic.  ACTIVITY: Your physical activity doesn't need to be restricted. However, if you are very active, you may see some blood in your urine. We suggest that you reduce your activity under these circumstances until the bleeding has stopped.  BOWELS: It is important to keep your bowels regular during the postoperative period. Straining with bowel movements can cause bleeding. A bowel movement every other day is reasonable. Use a mild laxative if needed, such as Milk of Magnesia 2-3 tablespoons, or 2 Dulcolax tablets. Call if you continue to have problems. If you have been taking narcotics for pain, before, during or after your surgery, you may be constipated. Take a laxative if necessary.   MEDICATION: You should resume your pre-surgery medications unless told not to. In addition you will often be given an antibiotic to prevent infection. These should be taken as prescribed until the bottles are finished unless you are having an unusual reaction to one of the drugs.  PROBLEMS YOU SHOULD REPORT TO [place]:  Fevers over 100.5 Fahrenheit.  Heavy bleeding, or clots ( See above notes about blood in urine ).  Inability to urinate.  Drug reactions (  hives, rash, nausea, vomiting, diarrhea ).  Severe burning or pain with urination that is not improving.  FOLLOW-UP: You will need a follow-up appointment to monitor your progress. Call for this appointment at the number listed above. Usually the first appointment will be about three to fourteen days after your surgery.   Post Anesthesia Home Care Instructions  Activity: Get plenty of rest for the remainder of the day. A responsible adult should stay with you for 24 hours following the procedure.  For the next 24 hours, DO NOT: -Drive a car -Conservation officer, nature -Drink alcoholic beverages -Take any medication unless instructed by your physician -Make any legal decisions or sign important papers.  Meals: Start with liquid foods such as gelatin or soup. Progress to regular foods as tolerated. Avoid greasy, spicy, heavy foods. If nausea and/or vomiting occur, drink only clear liquids until the nausea and/or vomiting subsides. Call your physician if vomiting continues.  Special Instructions/Symptoms: Your throat may feel dry or sore from the anesthesia or the breathing tube placed in your throat during surgery. If this causes discomfort, gargle with warm salt water. The discomfort should disappear within 24 hours.

## 2014-06-08 NOTE — Brief Op Note (Signed)
06/08/2014  2:38 PM  PATIENT:  Army Fossa  44 y.o. female  PRE-OPERATIVE DIAGNOSIS:  BILATERAL NEPHROLITHIASIS  POST-OPERATIVE DIAGNOSIS:  BILATERAL NEPHROLITHIASIS  PROCEDURE:  Procedure(s): CYSTOSCOPY WITH BILATERAL RETROGRADE PYELOGRAM/ BILATERAL URETERAL STENT PLACEMENT/ BILATERAL URETEROSCOPY, LASER LITHOTRIPSY/STONE BASKETRY (Bilateral) HOLMIUM LASER APPLICATION (Left)  SURGEON:  Surgeon(s) and Role:    * Alexis Frock, MD - Primary  PHYSICIAN ASSISTANT:   ASSISTANTS: none   ANESTHESIA:   general  EBL:  Total I/O In: 700 [I.V.:700] Out: -   BLOOD ADMINISTERED:none  DRAINS: none   LOCAL MEDICATIONS USED:  NONE  SPECIMEN:  Source of Specimen:  bilateral urolithiasis  DISPOSITION OF SPECIMEN:  [facility] for compositional analysis  COUNTS:  YES  TOURNIQUET:  * No tourniquets in log *  DICTATION: .Other Dictation: Dictation Number [id number]  PLAN OF CARE: Discharge to home after PACU  PATIENT DISPOSITION:  PACU - hemodynamically stable.   Delay start of Pharmacological VTE agent (>24hrs) due to surgical blood loss or risk of bleeding: yes

## 2014-06-08 NOTE — Transfer of Care (Signed)
Immediate Anesthesia Transfer of Care Note  Patient: Theresa Solis  Procedure(s) Performed: Procedure(s) (LRB): CYSTOSCOPY WITH BILATERAL RETROGRADE PYELOGRAM/ BILATERAL URETERAL STENT PLACEMENT/ BILATERAL URETEROSCOPY, LASER LITHOTRIPSY/STONE BASKETRY (Bilateral) HOLMIUM LASER APPLICATION (Left)  Patient Location: PACU  Anesthesia Type: General  Level of Consciousness: awake, alert  and oriented  Airway & Oxygen Therapy: Patient Spontanous Breathing and Patient connected to nasal cannula oxygen  Post-op Assessment: Report given to PACU RN and Post -op Vital signs reviewed and stable  Post vital signs: Reviewed and stable  Complications: No apparent anesthesia complicationsLast Vitals:  Filed Vitals:   06/08/14 1236  BP: 119/76  Pulse: 82  Temp: 36.9 C  Resp: 16

## 2014-06-08 NOTE — H&P (Signed)
Theresa Solis is an 44 y.o. female.    Chief Complaint: Pre op ureteroscopic stone manipulaiton  HPI:   1 - Nephrolithiasis - Rt 26m mid ureteral with mod hydro (not visible on scout images, adjacent to L3-4 interspace) by ER CT 05/2014. Also left 445mnon-obstructing renal and bilateral punctate stones. She is s/p BTL. Currently managed with percocet and phenergan.   [place] and is very anxious about traveling with colic.   2 - Medical Stone Disease -  Eval 2016: BMP, PTH, Urate - normal; Composition - pending; 24 Hr Urines - pending  PMH sig for ENT surgery. NO CV disease. No blood thinners. Her PCP is Dr. RaAshby Dawes  Today "Theresa Mercuryis seen to proceed with bilateral ureteroscopic stone manipulation with goal of stone free. NO interval fevers.   Past Medical History  Diagnosis Date  . Nephrolithiasis     BILATERAL  . History of gastroesophageal reflux (GERD)   . Wears contact lenses     Past Surgical History  Procedure Laterality Date  . Excision cyst mandible  1989  . Nasal fracture surgery  2002  . Tonsillectomy  2008  . Tubal ligation  2004    PPTL    History reviewed. No pertinent family history. Social History:  reports that she has never smoked. She has never used smokeless tobacco. She reports that she does not drink alcohol or use illicit drugs.  Allergies:  Allergies  Allergen Reactions  . Codeine Nausea And Vomiting  . Tetracyclines & Related Nausea And Vomiting    No prescriptions prior to admission    No results found for this or any previous visit (from the past 48 hour(s)). No results found.  Review of Systems  Constitutional: Negative.   HENT: Negative.   Eyes: Negative.   Respiratory: Negative.   Cardiovascular: Negative.   Gastrointestinal: Negative.   Genitourinary: Positive for hematuria.  Musculoskeletal: Negative.   Skin: Negative.   Neurological: Negative.   Endo/Heme/Allergies:  Negative.   Psychiatric/Behavioral: Negative.     Last menstrual period 05/21/2014. Physical Exam  Constitutional: She appears well-developed.  HENT:  Head: Normocephalic.  Eyes: Pupils are equal, round, and reactive to light.  Neck: Normal range of motion.  Cardiovascular: Normal rate.   Respiratory: Effort normal.  GI: Soft.  Genitourinary:  Mild Rt CVAT  Musculoskeletal: Normal range of motion.  Neurological: She is alert.  Skin: Skin is warm.  Psychiatric: She has a normal mood and affect. Her behavior is normal. Judgment and thought content normal.     Assessment/Plan       1 - Nephrolithiasis -   We rediscussed ureteroscopic stone manipulation with basketing and laser-lithotripsy in detail.  We rediscussed risks including bleeding, infection, damage to kidney / ureter  bladder, rarely loss of kidney. We rediscussed anesthetic risks and rare but serious surgical complications including DVT, PE, MI, and mortality. We specifically readdressed that in 5-10% of cases a staged approach is required with stenting followed by re-attempt ureteroscopy if anatomy unfavorable.   The patient voiced understanding and wises to proceed today as planned.  2 - Medical Stone Disease - Rec eval as multifocal and young age, partial eval completed.   Kyan Giannone 06/08/2014, 8:13 AM

## 2014-06-09 ENCOUNTER — Encounter (HOSPITAL_BASED_OUTPATIENT_CLINIC_OR_DEPARTMENT_OTHER): Payer: Self-pay | Admitting: Urology

## 2014-06-09 NOTE — Op Note (Signed)
Theresa Solis, SCAROLA NO.:  [id number]  MEDICAL RECORD NO.:  [id number]  LOCATION:                                 FACILITY:  PHYSICIAN:  Alexis Frock, MD     DATE OF BIRTH:  26-Oct-1970  DATE OF PROCEDURE:  06/08/2014 DATE OF DISCHARGE:  06/08/2014                              OPERATIVE REPORT   DIAGNOSES:  Right ureteral, bilateral renal stones.  PROCEDURE: 1. Cystoscopy with bilateral retrograde pyelogram interpretation. 2. Bilateral ureteroscopy with laser lithotripsy. 3. Insertion of bilateral ureteral stents, 5 x 24 Polaris with tether.  ESTIMATED BLOOD LOSS:  Nil.  COMPLICATIONS:  None.  SPECIMENS:  Bilateral renal and ureteral stone fragments for compositional analysis.  FINDINGS: 1. Impacted right distal ureteral stone at the ureterovesical     junction. 2. Area of relative narrowing at the UPJ without focal stricture, this     did not allow easy passage of the flexible digital ureteroscope to     the level of the right kidney. 3. Multifocal left renal stones, largest in the upper pole. 4. Complete resolution of all visible stone fragments larger than     1/3rd mm following laser lithotripsy and stone extraction. 5. Placement of bilateral ureteral stents proximal in renal pelvis and     distal in urinary bladder.  INDICATION:  Theresa Solis is a pleasant 44 year old lady with recent history of superior right colicky flank pain.  She was found on workup of this to have a very small right ureteral stone only approximately 3 mm.  She also has right punctate renal stones and multifocal left renal stones, largest of which was approximately 7 mm.  Options were discussed for management including medical expulsive therapy versus shockwave lithotripsy versus ureteroscopic stone manipulation unilateral versus bilateral, and she adamantly wished to proceed with bilateral ureteroscopic stone manipulation for goal of stone free.  Notably, the patient is  traveling in the next several weeks and wants to achieve stone free status prior, this is certainly reasonable, as such informed consent was obtained and placed in medical record.  PROCEDURE IN DETAIL:  The patient being Theresa Solis was verified. Procedure being bilateral ureteroscopic stone manipulation was confirmed.  Procedure was carried out.  Time-out was performed. Intravenous antibiotics were administered.  General LMA anesthesia was introduced.  The patient was placed into a low lithotomy position. Sterile field was created by prepping and draping the patient's vagina, introitus, and proximal thighs using iodine x3.  Next, cystourethroscopy was performed using a 22-French rigid cystoscope with 30-degree offset lens.  Inspection of the urinary bladder revealed bilateral ureteral orifices in normal anatomic position.  There was significant erythema and inflammation at the area of the right ureteral orifice concerning for probable impacted stone at this location.  The urinary bladder is otherwise unremarkable.  The right ureteral orifice was cannulated with a 6-French end-hole catheter and right retrograde pyelogram was obtained.  Right retrograde pyelogram demonstrated single right ureter, single system right kidney.  No obvious filling defects or narrowing noted.  A 0.038 zip wire was advanced at the level of upper pole and set aside as a safety wire.  An 8-French feeding tube was placed in  the urinary bladder for pressure release.  Next, semi-rigid ureteroscopy was performed in the distal right ureter alongside a separate Sensor working wire.  As expected at the area of the ureterovesical junction, there was an impacted small ureteral stone.  This did appear to be too large for simple basketing, as such, holmium laser energy applied to the stone using settings of 0.2 joules and 10 Hz fragmenting the stone into 3 smaller pieces, which had been removed at the level of the  urinary bladder.  Semi-rigid ureteroscopy was performed informed in the distal two-thirds of the right ureter and no additional calcifications or mucosal abnormalities were found.  As the goal was to achieve complete stone free status on the right, the semi-rigid ureteroscope was then exchanged for the 12/14, 24 cm ureteral access sheath over the Sensor working wire at the level of the proximal ureter.  Next, flexible digital ureteroscopy was performed using single channel digital ureteroscope, which allowed inspection of the proximal right ureter. There were no calcifications or abnormality seen.  At the area of the UPJ, there was a relative narrowing noted such that the flexible ureteroscope would not easily traverse this location with a safety wire in place.  This was not a focal stricture or crossing vessel per se, which was felt to be a slight amount of relative narrowing.  As early punctate calcifications noted on the right side on CT scan, it was felt that excessive manipulation of this area would not be prudent to avoid any injury to the right UPJ, as such the sheath was removed under continuous ureteroscopic vision.  No mucosal abnormalities were found and  the right safety wire was left in situ at this point.  The left ureteral orifice was cannulated with a 6-French end-hole catheter and left retrograde pyelogram was obtained.  Left retrograde pyelogram demonstrated a single left ureter, single system left kidney.  No filling defects or narrowing noted.  A separate 0.038 zip wire was advanced at the level of the upper pole and set aside as a safety wire.  Next, semi-rigid ureteroscopy was performed of the distal two-thirds of the left ureter alongside a separate Sensor working wire.  No mucosal abnormalities were found.  The semi-rigid ureteroscope was exchanged for a 12/14, 24 cm ureteral access sheath under continuous fluoroscopic guidance to the level of the proximal  ureter.  Next, flexible digital ureteroscopy was performed on the left side.  Final inspection of the proximal ureter and pan endoscopic examination of each calix and left kidney x2.  Two areas of dominant calcification were noted that was in upper pole stone that was felt to represent the larger stone seen on previous imaging.  There was also a smaller lower pole stone, these appeared to be too large for simple basketing.  As such, the holmium laser was used to fragment the stones using a dusting technique into pieces 1/3 mm or less in diameter.  The scope and  the access sheath were then withdrawn under continuous ureteroscopic vision and no mucosal abnormalities were found.  As there has been bilateral sheath placed and bilateral manipulation, it was clearly felt bilateral ureteral stenting would be warranted to prevent bilateral obstructing ureteral edema.  As such a 5 x 24 Polaris-type stent was placed first on the left side using fluoroscopic guidance.  Good proximal and distal deployment were noted.  Tether was left in place.  Similarly, a 5 x 24 Polaris-type stent was placed on the right side over the remaining  safety wire.  Good proximal and distal deployment were noted.  The bladder had been emptied per cystoscope and tethers were fashioned at this time.  Procedure was terminated.  Notably the small stone fragments in the right ureter were irrigated and set aside for compositional analysis.  The patient was then awakened, taken to the postanesthesia care unit in stable condition.          ______________________________ Alexis Frock, MD     TM/MEDQ  D:  06/08/2014  T:  06/09/2014  Job:  [id number]

## 2016-01-02 ENCOUNTER — Ambulatory Visit (INDEPENDENT_AMBULATORY_CARE_PROVIDER_SITE_OTHER): Payer: Managed Care, Other (non HMO) | Admitting: Orthopedic Surgery

## 2016-01-02 DIAGNOSIS — M25561 Pain in right knee: Secondary | ICD-10-CM

## 2016-01-02 DIAGNOSIS — M25511 Pain in right shoulder: Secondary | ICD-10-CM

## 2016-01-02 DIAGNOSIS — M25512 Pain in left shoulder: Secondary | ICD-10-CM

## 2016-03-24 HISTORY — PX: OTHER SURGICAL HISTORY: SHX169

## 2016-03-24 HISTORY — PX: ENDOMETRIAL ABLATION: SHX621

## 2017-07-27 ENCOUNTER — Ambulatory Visit (INDEPENDENT_AMBULATORY_CARE_PROVIDER_SITE_OTHER): Payer: Managed Care, Other (non HMO)

## 2017-07-27 ENCOUNTER — Encounter (INDEPENDENT_AMBULATORY_CARE_PROVIDER_SITE_OTHER): Payer: Self-pay | Admitting: Orthopedic Surgery

## 2017-07-27 ENCOUNTER — Ambulatory Visit (INDEPENDENT_AMBULATORY_CARE_PROVIDER_SITE_OTHER): Payer: Managed Care, Other (non HMO) | Admitting: Orthopedic Surgery

## 2017-07-27 DIAGNOSIS — G8929 Other chronic pain: Secondary | ICD-10-CM | POA: Diagnosis not present

## 2017-07-27 DIAGNOSIS — M545 Low back pain: Secondary | ICD-10-CM | POA: Diagnosis not present
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
## 2017-08-10 ENCOUNTER — Telehealth (INDEPENDENT_AMBULATORY_CARE_PROVIDER_SITE_OTHER): Payer: Self-pay | Admitting: Orthopedic Surgery

## 2017-08-10 MED ORDER — METHOCARBAMOL 500 MG PO TABS: 500.0000 mg | ORAL_TABLET | Freq: Two times a day (BID) | ORAL | 0 refills | Status: DC | PRN

## 2017-08-10 NOTE — Telephone Encounter (Signed)
IC patient and discussed, appt made for June 17 to followup. She will call in the interim if symptoms worsen.  Robaxin sent to her pharmacy

## 2017-08-10 NOTE — Telephone Encounter (Signed)
She can go with Tylenol and anti-inflammatories and a muscle relaxer if needed such as Robaxin 1 p.o. every 8 hours as needed pain and spasm.  Trying to avoid narcotics in this situation.  Not entirely clear why insurance did not approve MRI scan but occasionally they will require a formal course of physical therapy for 6 weeks prior to authorizing scan and that may need to be what will have to to occur in order to get the scan done

## 2017-08-10 NOTE — Telephone Encounter (Signed)
[employer] insurance   Pt is sched for MRI tomorrow and insurance company states they will not approve Mri. Pt would like to know what can she do for pain until everything is fixed pertaining  to MRI.

## 2017-08-10 NOTE — Telephone Encounter (Signed)
MRI was denied by insurance, see below about pain? Pls advise.

## 2017-08-11 ENCOUNTER — Inpatient Hospital Stay: Admission: RE | Admit: 2017-08-11 | Payer: Managed Care, Other (non HMO) | Source: Ambulatory Visit

## 2017-09-07 ENCOUNTER — Ambulatory Visit (INDEPENDENT_AMBULATORY_CARE_PROVIDER_SITE_OTHER): Payer: Managed Care, Other (non HMO) | Admitting: Orthopedic Surgery

## 2017-09-07 ENCOUNTER — Encounter (INDEPENDENT_AMBULATORY_CARE_PROVIDER_SITE_OTHER): Payer: Self-pay | Admitting: Orthopedic Surgery

## 2017-09-07 DIAGNOSIS — M545 Low back pain, unspecified: Secondary | ICD-10-CM

## 2017-09-07 DIAGNOSIS — M5441 Lumbago with sciatica, right side: Secondary | ICD-10-CM

## 2017-09-07 DIAGNOSIS — G8929 Other chronic pain: Secondary | ICD-10-CM

## 2017-09-12 ENCOUNTER — Encounter (INDEPENDENT_AMBULATORY_CARE_PROVIDER_SITE_OTHER): Payer: Self-pay | Admitting: Orthopedic Surgery

## 2017-09-12 NOTE — Progress Notes (Signed)
Office Visit Note   Patient: Theresa Solis           Date of Birth: [birth year]           MRN: [id number] Visit Date: 09/07/2017 Requested by: Merrilee Seashore, [address] [place] [address] [place], [place] [address] PCP: Merrilee Seashore, MD  Subjective: Chief Complaint  Patient presents with  . Lower Back - Follow-up    HPI: Aquita is a patient with low back pain.  She is had a long history of low back pain for over a year.  She had difficulty in worsening of her symptoms when she was positioned in an awkward manner on the oral surgery table.  She is been doing a home exercise program of stretching and strengthening for the past 6 weeks.  She is also tried muscle relaxer and anti-inflammatory for the past several months.  She is undergone chiropractic treatment for at least 6 weeks and stopped that as well.  Reports primarily right-sided radiculopathy which wakes her from sleep at night.  She also uses a brace.  She has been under a supervised treatment program for well over 6 weeks and has had symptoms for over a year.              ROS: All systems reviewed are negative as they relate to the chief complaint within the history of present illness.  Patient denies  fevers or chills.   Assessment & Plan: Visit Diagnoses:  1. Chronic midline low back pain without sciatica   2. Chronic midline low back pain with right-sided sciatica     Plan: Impression is low back pain with right-sided radiculopathy.  Plan is MRI L-spine.  Continue with stretching and avoid a lot of loading in terms of carrying heavy objects away from midline.  Follow-up after the MRI scan.  Follow-Up Instructions: Return for after MRI.   Orders:  Orders Placed This Encounter  Procedures  . MR Lumbar Spine w/o contrast   No orders of the defined types were placed in this encounter.     Procedures: No procedures performed   Clinical Data: No additional findings.  Objective: Vital Signs:  There were no vitals taken for this visit.  Physical Exam:   Constitutional: Patient appears well-developed HEENT:  Head: Normocephalic Eyes:EOM are normal Neck: Normal range of motion Cardiovascular: Normal rate Pulmonary/chest: Effort normal Neurologic: Patient is alert Skin: Skin is warm Psychiatric: Patient has normal mood and affect    Ortho Exam: Ortho exam demonstrates full active and passive range of motion of the knees hips and ankle.  No nerve root tension signs on the left but she has mildly positive nerve root tension signs on the right.  She has no paresthesias L1 S1 bilaterally and symmetric reflexes.  Pedal pulses palpable.  She does have pain with forward and lateral bending.  Specialty Comments:  No specialty comments available.  Imaging: No results found.   PMFS History: There are no active problems to display for this patient.  Past Medical History:  Diagnosis Date  . History of gastroesophageal reflux (GERD)   . Nephrolithiasis    BILATERAL  . Wears contact lenses     History reviewed. No pertinent family history.  Past Surgical History:  Procedure Laterality Date  . CYSTOSCOPY WITH RETROGRADE PYELOGRAM, URETEROSCOPY AND STENT PLACEMENT Bilateral 06/08/2014   Procedure: CYSTOSCOPY WITH BILATERAL RETROGRADE PYELOGRAM/ BILATERAL URETERAL STENT PLACEMENT/ BILATERAL URETEROSCOPY, LASER LITHOTRIPSY/STONE BASKETRY;  Surgeon: Alexis Frock, MD;  Location: [facility];  Service: Urology;  Laterality: Bilateral;  . EXCISION CYST MANDIBLE  1989  . HOLMIUM LASER APPLICATION Left 6/94/5038   Procedure: HOLMIUM LASER APPLICATION;  Surgeon: Alexis Frock, MD;  Location: [facility];  Service: Urology;  Laterality: Left;  . NASAL FRACTURE SURGERY  2002  . TONSILLECTOMY  2008  . TUBAL LIGATION  2004   PPTL   Social History   Occupational History  . Not on file  Tobacco Use  . Smoking status: Never Smoker  . Smokeless tobacco:  Never Used  Substance and Sexual Activity  . Alcohol use: No  . Drug use: No  . Sexual activity: Not on file

## 2017-09-22 ENCOUNTER — Ambulatory Visit
Admission: RE | Admit: 2017-09-22 | Discharge: 2017-09-22 | Disposition: A | Discharge status: Home / Self Care | Payer: Managed Care, Other (non HMO) | Source: Ambulatory Visit | Attending: Orthopedic Surgery | Admitting: Orthopedic Surgery

## 2017-09-22 DIAGNOSIS — M5441 Lumbago with sciatica, right side: Principal | ICD-10-CM

## 2017-09-22 DIAGNOSIS — G8929 Other chronic pain: Secondary | ICD-10-CM

## 2017-09-30 ENCOUNTER — Encounter (INDEPENDENT_AMBULATORY_CARE_PROVIDER_SITE_OTHER): Payer: Self-pay | Admitting: Orthopedic Surgery

## 2017-09-30 ENCOUNTER — Ambulatory Visit (INDEPENDENT_AMBULATORY_CARE_PROVIDER_SITE_OTHER): Payer: Managed Care, Other (non HMO) | Admitting: Orthopedic Surgery

## 2017-09-30 DIAGNOSIS — M5441 Lumbago with sciatica, right side: Secondary | ICD-10-CM | POA: Diagnosis not present

## 2017-09-30 DIAGNOSIS — G8929 Other chronic pain: Secondary | ICD-10-CM

## 2017-09-30 MED ORDER — PREDNISONE 5 MG (21) PO TBPK: ORAL_TABLET | ORAL | 0 refills | Status: DC

## 2017-10-04 ENCOUNTER — Encounter (INDEPENDENT_AMBULATORY_CARE_PROVIDER_SITE_OTHER): Payer: Self-pay | Admitting: Orthopedic Surgery

## 2017-10-04 NOTE — Progress Notes (Signed)
Office Visit Note   Patient: Theresa Solis           Date of Birth: 1971-01-29           MRN: [id number] Visit Date: 09/30/2017 Requested by: Merrilee Seashore, [address] [place],  [address] PCP: Merrilee Seashore, MD  Subjective: Chief Complaint  Patient presents with  . Lower Back - Follow-up    HPI: Theresa Solis is a patient with lumbar spine and possible sacroiliac pain.  She uses a brace which helps which is more of a compression type pelvic brace.  She does get sore when she walks a lot.  She has a history of going to the chiropractor where realignment needs to help but she has not done it recently because it has not been helping.  Reports primarily midline and right sided pain in the lower lumbar region.  She is tried muscle relaxers and anti-inflammatories.  This all started when she was in a dental chair 4.  Longer than she anticipated.  The pain will radiate to her knee at times.              ROS: All systems reviewed are negative as they relate to the chief complaint within the history of present illness.  Patient denies  fevers or chills.   Assessment & Plan: Visit Diagnoses:  1. Chronic midline low back pain with right-sided sciatica     Plan: Impression is relatively benign MRI scan for macroscopic spine pathology.  I still think she may have irritated a nerve root or potentially her right-sided SI joint.  I would like to put her on a Medrol Dosepak 6-day course and refer her to Dr. Ernestina Patches for consult regarding appropriateness of the either lumbar spine ESI and/or SI joint injection on the right.  I do not need to see her back regarding her back.  Follow-Up Instructions: No follow-ups on file.   Orders:  Orders Placed This Encounter  Procedures  . Ambulatory referral to Physical Medicine Rehab   Meds ordered this encounter  Medications  . predniSONE (STERAPRED UNI-PAK 21 TAB) 5 MG (21) TBPK tablet    Sig: Take dosepak as directed      Dispense:  21 tablet    Refill:  0      Procedures: No procedures performed   Clinical Data: No additional findings.  Objective: Vital Signs: There were no vitals taken for this visit.  Physical Exam:   Constitutional: Patient appears well-developed HEENT:  Head: Normocephalic Eyes:EOM are normal Neck: Normal range of motion Cardiovascular: Normal rate Pulmonary/chest: Effort normal Neurologic: Patient is alert Skin: Skin is warm Psychiatric: Patient has normal mood and affect    Ortho Exam: Ortho exam demonstrates normal gait and alignment.  Pretty reasonable flexibility.  Good ankle dorsiflexion plantarflexion quad and hamstring strength.  No masses lymph adenopathy or skin changes noted in the back region.  No definitive sacroiliac joint tenderness.  No trochanteric tenderness.  No other masses lymphadenopathy or skin changes noted in that back or right SI joint region.  No groin pain with internal/external rotation of the leg.  Specialty Comments:  No specialty comments available.  Imaging: No results found.   PMFS History: There are no active problems to display for this patient.  Past Medical History:  Diagnosis Date  . History of gastroesophageal reflux (GERD)   . Nephrolithiasis    BILATERAL  . Wears contact lenses     History reviewed. No pertinent family history.  Past Surgical History:  Procedure Laterality Date  . CYSTOSCOPY WITH RETROGRADE PYELOGRAM, URETEROSCOPY AND STENT PLACEMENT Bilateral 06/08/2014   Procedure: CYSTOSCOPY WITH BILATERAL RETROGRADE PYELOGRAM/ BILATERAL URETERAL STENT PLACEMENT/ BILATERAL URETEROSCOPY, LASER LITHOTRIPSY/STONE BASKETRY;  Surgeon: Alexis Frock, MD;  Location: [facility];  Service: Urology;  Laterality: Bilateral;  . EXCISION CYST MANDIBLE  1989  . HOLMIUM LASER APPLICATION Left 10/06/9676   Procedure: HOLMIUM LASER APPLICATION;  Surgeon: Alexis Frock, MD;  Location: [facility];  Service: Urology;  Laterality: Left;  . NASAL FRACTURE SURGERY  2002  . TONSILLECTOMY  2008  . TUBAL LIGATION  2004   PPTL   Social History   Occupational History  . Not on file  Tobacco Use  . Smoking status: Never Smoker  . Smokeless tobacco: Never Used  Substance and Sexual Activity  . Alcohol use: No  . Drug use: No  . Sexual activity: Not on file

## 2017-11-03 ENCOUNTER — Ambulatory Visit (INDEPENDENT_AMBULATORY_CARE_PROVIDER_SITE_OTHER): Payer: Managed Care, Other (non HMO) | Admitting: Physical Medicine and Rehabilitation

## 2017-11-03 ENCOUNTER — Telehealth (INDEPENDENT_AMBULATORY_CARE_PROVIDER_SITE_OTHER): Payer: Self-pay | Admitting: Physical Medicine and Rehabilitation

## 2017-11-03 VITALS — BP 116/78 | HR 79 | Temp 98.2°F | Ht 68.0 in | Wt 138.8 lb

## 2017-11-03 DIAGNOSIS — M533 Sacrococcygeal disorders, not elsewhere classified: Secondary | ICD-10-CM | POA: Diagnosis not present

## 2017-11-03 DIAGNOSIS — G894 Chronic pain syndrome: Secondary | ICD-10-CM | POA: Diagnosis not present

## 2017-11-03 DIAGNOSIS — M545 Low back pain: Secondary | ICD-10-CM | POA: Diagnosis not present

## 2017-11-03 DIAGNOSIS — M25551 Pain in right hip: Secondary | ICD-10-CM | POA: Diagnosis not present

## 2017-11-03 DIAGNOSIS — G8929 Other chronic pain: Secondary | ICD-10-CM

## 2017-11-03 NOTE — Patient Instructions (Signed)
Dr. Tressie Ellis McGill  "The Big Three" that will safely increase your endurance and protect your back: modified curl-up, side bridge, and bird dog.  1. Modified Curl-Up Lie your back with one knee bent and one knee straight, this puts your pelvis in a neutral position and the muscles of your core in an optimal alignment of pull to avoid strain on the low back. Place your hands under the arch of your low back and ensure that this arch is maintained throughout the curl-up. Start by bracing your abdomen; this is different from flexing your abs, bear down through your belly. Now make sure you can take a breath in and a breath out while maintaining this brace. If you cannot, stop there and practice doing just that until you've got it mastered! Now, pretend that your spine in your neck and your upper back are cemented together and do not move independently. Pick a spot on the ceiling and focus your gaze there, lift your shoulder blades about 30 off the floor and slowly return to the start position. Take note of your neck, and ensure that your chin isn't poking forward when you do a curl up. If you're struggling with that, focus on making a double chin. Perform 3 sets of 10-12.  2. Side Bridge Lie on your side and prop yourself up on your elbow. Ensure that your elbow is directly under your shoulder to avoid any unnecessary strain through your shoulder joint. With your legs straight, place your top foot on the ground in front of your bottom foot. Place your top hand on your bottom shoulder. While maintaining the natural curve of your spine, that is to say, be sure that your upper body isn't twisted or leaning forward, brace your abdomen, squeeze through your gluteals (clench your bum), and lift your hips up off the ground. Don't forget to breathe! Hold for 8-10 seconds, repeat 3 times. As the exercise becomes easier, increase the number of repetitions as opposed to the length of time. There are a number of ways to  modify this exercise in order to increase or decrease the difficulty such as the example below on the right. If it's not challenging enough, try putting that top hand on your top hip, or straight up in the air, but again, be sure your body stays straight!  3. Bird Dog Start on your hands and knees with your hands shoulder width apart directly under your shoulders, and knees hip width apart directly under your hips. Maintain a neutral spine. Brace through your abdomen and squeeze your gluteals. Ensure you can maintain this while you take a breath in and out. Lift your right arm in front until it's level with your shoulder, squeezing the muscles between your shoulder blades as you do so. At the same time, extend your left leg straight back until it is level with your hips, squeezing your gluteals, and keeping your hips square to the floor. Return to the starting position in a slow and controlled manner, and perform the same action with the left arm and right leg. That is one repetition. Perform 3 sets of 8-10 repetitions. As this exercise becomes easy, focus on co-contracting the muscles of your forearm and arms while you extend, the same goes for the muscles of your legs. For an additional challenge, instead of putting your hand and knee back down on the ground between reps, try just sweeping the floor and performing the next rep right away, or draw a square with your  arm and leg and then sweep the floor.  *Avoid exercises that forward flex the spine or extend the spine.

## 2017-11-03 NOTE — Progress Notes (Signed)
.  Numeric Pain Rating Scale and Functional Assessment Average Pain 8 Pain Right Now 3 My pain is intermittent Pain is worse with: bending, sitting, standing and some activites Pain improves with: therapy/exercise and medication   In the last MONTH (on 0-10 scale) has pain interfered with the following?  1. General activity like being  able to carry out your everyday physical activities such as walking, climbing stairs, carrying groceries, or moving a chair?  Rating(7)  2. Relation with others like being able to carry out your usual social activities and roles such as  activities at home, at work and in your community. Rating(5)  3. Enjoyment of life such that you have  been bothered by emotional problems such as feeling anxious, depressed or irritable?  Rating(3)

## 2017-11-04 NOTE — Telephone Encounter (Signed)
No prior auth for joint injections required.

## 2017-11-12 ENCOUNTER — Ambulatory Visit (INDEPENDENT_AMBULATORY_CARE_PROVIDER_SITE_OTHER): Payer: Self-pay

## 2017-11-12 ENCOUNTER — Ambulatory Visit (INDEPENDENT_AMBULATORY_CARE_PROVIDER_SITE_OTHER): Payer: Managed Care, Other (non HMO) | Admitting: Physical Medicine and Rehabilitation

## 2017-11-12 ENCOUNTER — Encounter (INDEPENDENT_AMBULATORY_CARE_PROVIDER_SITE_OTHER): Payer: Self-pay | Admitting: Physical Medicine and Rehabilitation

## 2017-11-12 DIAGNOSIS — M25551 Pain in right hip: Secondary | ICD-10-CM

## 2017-11-12 DIAGNOSIS — M461 Sacroiliitis, not elsewhere classified: Secondary | ICD-10-CM

## 2017-11-12 NOTE — Patient Instructions (Signed)

## 2017-11-12 NOTE — Progress Notes (Signed)
 .  Numeric Pain Rating Scale and Functional Assessment Average Pain 5   In the last MONTH (on 0-10 scale) has pain interfered with the following?  1. General activity like being  able to carry out your everyday physical activities such as walking, climbing stairs, carrying groceries, or moving a chair?  Rating(4)   -Dye Allergies.

## 2017-11-12 NOTE — Progress Notes (Signed)
BRITTAY MOGLE - 47 y.o. female MRN [id number]  Date of birth: [birth year]  Office Visit Note: Visit Date: 11/12/2017 PCP: Merrilee Seashore, MD Referred by: Merrilee Seashore, MD  Subjective: Chief Complaint  Patient presents with  . Lower Back - Pain   HPI: Mrs. Limb is a 47 year old female that we recently saw for evaluation management.  She comes in today for planned right sacroiliac joint injection.  Please see our prior notes for further details and justification.   ROS Otherwise per HPI.  Assessment & Plan: Visit Diagnoses:  1. Pain in right hip   2. Sacroiliitis ([place])     Plan: No additional findings.   Meds & Orders: No orders of the defined types were placed in this encounter.   Orders Placed This Encounter  Procedures  . Sacroiliac Joint Inj  . XR C-ARM NO REPORT    Follow-up: Return in about 4 weeks (around 12/10/2017).   Procedures: Sacroiliac Joint Inj on 11/12/2017 9:21 AM Indications: pain and diagnostic evaluation Details: 22 G 3.5 in needle, fluoroscopy-guided posterior approach Medications: 2 mL bupivacaine 0.5 %; 80 mg methylPREDNISolone acetate 80 MG/ML Outcome: tolerated well, no immediate complications  There was excellent flow of contrast producing a partial arthrogram of the sacroiliac joint.  Procedure, treatment alternatives, risks and benefits explained, specific risks discussed. Consent was given by the patient. Immediately prior to procedure a time out was called to verify the correct patient, procedure, equipment, support staff and site/side marked as required. Patient was prepped and draped in the usual sterile fashion.      No notes on file   Clinical History: MRI LUMBAR SPINE WITHOUT CONTRAST  TECHNIQUE: Multiplanar, multisequence MR imaging of the lumbar spine was performed. No intravenous contrast was administered.  COMPARISON:  Lumbar radiographs dated 07/27/2017  FINDINGS: Segmentation:   Standard.  Alignment:  Physiologic.  Vertebrae:  No fracture, evidence of discitis, or bone lesion.  Conus medullaris and cauda equina: Conus extends to the L1 level. Conus and cauda equina appear normal.  Paraspinal and other soft tissues: Negative.  Disc levels:  L1-2: Normal.  L2-3: Normal.  L3-4: Normal.  L4-5: Very tiny broad-based disc bulge with no neural impingement. Otherwise normal.  L5-S1: Normal.  No spinal or foraminal stenosis. No facet arthritis in the lumbar spine.  IMPRESSION: No significant abnormality of the lumbar spine.   Electronically Signed   By: Lorriane Shire M.D.   On: 09/22/2017 11:25   She reports that she has never smoked. She has never used smokeless tobacco. No results for input(s): HGBA1C, LABURIC in the last 8760 hours.  Objective:  VS:  HT:    WT:   BMI:     BP:   HR: bpm  TEMP: ( )  RESP:  Physical Exam  Ortho Exam Imaging: No results found.  Past Medical/Family/Surgical/Social History: Medications & Allergies reviewed per EMR, new medications updated. There are no active problems to display for this patient.  Past Medical History:  Diagnosis Date  . History of gastroesophageal reflux (GERD)   . Nephrolithiasis    BILATERAL  . Wears contact lenses    History reviewed. No pertinent family history. Past Surgical History:  Procedure Laterality Date  . CYSTOSCOPY WITH RETROGRADE PYELOGRAM, URETEROSCOPY AND STENT PLACEMENT Bilateral 06/08/2014   Procedure: CYSTOSCOPY WITH BILATERAL RETROGRADE PYELOGRAM/ BILATERAL URETERAL STENT PLACEMENT/ BILATERAL URETEROSCOPY, LASER LITHOTRIPSY/STONE BASKETRY;  Surgeon: Alexis Frock, MD;  Location: [facility];  Service: Urology;  Laterality: Bilateral;  . EXCISION  CYST MANDIBLE  1989  . HOLMIUM LASER APPLICATION Left 4/66/5993   Procedure: HOLMIUM LASER APPLICATION;  Surgeon: Alexis Frock, MD;  Location: [facility];  Service: Urology;   Laterality: Left;  . NASAL FRACTURE SURGERY  2002  . TONSILLECTOMY  2008  . TUBAL LIGATION  2004   PPTL   Social History   Occupational History  . Not on file  Tobacco Use  . Smoking status: Never Smoker  . Smokeless tobacco: Never Used  Substance and Sexual Activity  . Alcohol use: No  . Drug use: No  . Sexual activity: Not on file

## 2017-11-26 MED ORDER — METHYLPREDNISOLONE ACETATE 80 MG/ML IJ SUSP
80.0000 mg | INTRAMUSCULAR | Status: AC | PRN
  Administered 2017-11-12: 80 mg via INTRA_ARTICULAR

## 2017-11-26 MED ORDER — BUPIVACAINE HCL 0.5 % IJ SOLN
2.0000 mL | INTRAMUSCULAR | Status: AC | PRN
  Administered 2017-11-12: 2 mL via INTRA_ARTICULAR

## 2017-12-08 ENCOUNTER — Encounter (INDEPENDENT_AMBULATORY_CARE_PROVIDER_SITE_OTHER): Payer: Self-pay | Admitting: Physical Medicine and Rehabilitation

## 2017-12-08 ENCOUNTER — Ambulatory Visit (INDEPENDENT_AMBULATORY_CARE_PROVIDER_SITE_OTHER): Payer: Managed Care, Other (non HMO) | Admitting: Physical Medicine and Rehabilitation

## 2017-12-08 VITALS — BP 106/75 | HR 81 | Ht 68.0 in | Wt 136.0 lb

## 2017-12-08 DIAGNOSIS — M533 Sacrococcygeal disorders, not elsewhere classified: Secondary | ICD-10-CM

## 2017-12-08 DIAGNOSIS — M25551 Pain in right hip: Secondary | ICD-10-CM | POA: Diagnosis not present

## 2017-12-08 DIAGNOSIS — M461 Sacroiliitis, not elsewhere classified: Secondary | ICD-10-CM

## 2017-12-08 DIAGNOSIS — M545 Low back pain: Secondary | ICD-10-CM

## 2017-12-08 DIAGNOSIS — G8929 Other chronic pain: Secondary | ICD-10-CM
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
## 2017-12-08 NOTE — Progress Notes (Signed)
.  Numeric Pain Rating Scale and Functional Assessment Average Pain 3 Pain Right Now 2 My pain is intermittent and aching Pain is worse with: bending and some activites Pain improves with: therapy/exercise   In the last MONTH (on 0-10 scale) has pain interfered with the following?  1. General activity like being  able to carry out your everyday physical activities such as walking, climbing stairs, carrying groceries, or moving a chair?  Rating(3)  2. Relation with others like being able to carry out your usual social activities and roles such as  activities at home, at work and in your community. Rating(0)  3. Enjoyment of life such that you have  been bothered by emotional problems such as feeling anxious, depressed or irritable?  Rating(0)

## 2017-12-09 ENCOUNTER — Other Ambulatory Visit (INDEPENDENT_AMBULATORY_CARE_PROVIDER_SITE_OTHER): Payer: Self-pay | Admitting: Physical Medicine and Rehabilitation

## 2017-12-09 DIAGNOSIS — M461 Sacroiliitis, not elsewhere classified: Secondary | ICD-10-CM

## 2017-12-17 NOTE — Progress Notes (Signed)
Theresa Solis - 47 y.o. female MRN [id number]  Date of birth: September 25, 1970  Office Visit Note: Visit Date: 12/08/2017 PCP: Merrilee Seashore, MD Referred by: Merrilee Seashore, MD  Subjective: Chief Complaint  Patient presents with  . Right Hip - Pain  . Lower Back - Pain   HPI: Theresa Solis is a 47 year old female that we saw her recently for sacroiliac joint injection and evaluation and management of low back pain which is significantly worsened.  The sacroiliac joint injection gave her about 80% relief in total and she is pretty happy with the results.  She gets some aching pain in the right hip and some stiffness in the lower back but better than she was.  She reports squatting makes the pain worse and she does get some relief with stretching.  She has had physical therapy in the past and I think it is her sister or sister-in-law that is a physical therapist.  She has not had specific work on the sacroiliac joint and I think diagnostically this is the main culprit of a lot of her back pain.  We have written a prescription and asked her to see the physical therapist for manual treatment and manipulation of the sacroiliac joint.  Going forward if it turned into be more of a chronic issue could look at diagnostic lateral branch blocks and radiofrequency ablation of the sacroiliac joint.  Alternative treatments would be prolotherapy.   Review of Systems  Musculoskeletal: Positive for back pain and joint pain.  All other systems reviewed and are negative.  Otherwise per HPI.  Assessment & Plan: Visit Diagnoses:  1. Pain in right hip   2. Sacroiliitis ([place])   3. Chronic bilateral low back pain without sciatica   4. Sacroiliac dysfunction     Plan: No additional findings.   Meds & Orders: No orders of the defined types were placed in this encounter.  No orders of the defined types were placed in this encounter.   Follow-up: Return if symptoms worsen or fail to improve.    Procedures: No procedures performed  No notes on file   Clinical History: MRI LUMBAR SPINE WITHOUT CONTRAST  TECHNIQUE: Multiplanar, multisequence MR imaging of the lumbar spine was performed. No intravenous contrast was administered.  COMPARISON:  Lumbar radiographs dated 07/27/2017  FINDINGS: Segmentation:  Standard.  Alignment:  Physiologic.  Vertebrae:  No fracture, evidence of discitis, or bone lesion.  Conus medullaris and cauda equina: Conus extends to the L1 level. Conus and cauda equina appear normal.  Paraspinal and other soft tissues: Negative.  Disc levels:  L1-2: Normal.  L2-3: Normal.  L3-4: Normal.  L4-5: Very tiny broad-based disc bulge with no neural impingement. Otherwise normal.  L5-S1: Normal.  No spinal or foraminal stenosis. No facet arthritis in the lumbar spine.  IMPRESSION: No significant abnormality of the lumbar spine.   Electronically Signed   By: Lorriane Shire M.D.   On: 09/22/2017 11:25   She reports that she has never smoked. She has never used smokeless tobacco. No results for input(s): HGBA1C, LABURIC in the last 8760 hours.  Objective:  VS:  HT:5' 8"  (172.7 cm)   WT:136 lb (61.7 kg)  BMI:20.68    BP:106/75  HR:81bpm  TEMP: ( )  RESP:  Physical Exam  Constitutional: She is oriented to person, place, and time. She appears well-developed and well-nourished.  Eyes: Pupils are equal, round, and reactive to light. Conjunctivae and EOM are normal.  Cardiovascular: Normal rate and intact  distal pulses.  Pulmonary/Chest: Effort normal.  Musculoskeletal:  Still with positive Fortin finger sign on the right.  Mildly positive Patrick's on the right.  Good distal strength and patient ambulates without aid with normal gait.  Neurological: She is alert and oriented to person, place, and time. She exhibits normal muscle tone.  Skin: Skin is warm and dry. No rash noted. No erythema.  Psychiatric: She has a  normal mood and affect. Her behavior is normal.  Nursing note and vitals reviewed.   Ortho Exam Imaging: No results found.  Past Medical/Family/Surgical/Social History: Medications & Allergies reviewed per EMR, new medications updated. There are no active problems to display for this patient.  Past Medical History:  Diagnosis Date  . History of gastroesophageal reflux (GERD)   . Nephrolithiasis    BILATERAL  . Wears contact lenses    History reviewed. No pertinent family history. Past Surgical History:  Procedure Laterality Date  . CYSTOSCOPY WITH RETROGRADE PYELOGRAM, URETEROSCOPY AND STENT PLACEMENT Bilateral 06/08/2014   Procedure: CYSTOSCOPY WITH BILATERAL RETROGRADE PYELOGRAM/ BILATERAL URETERAL STENT PLACEMENT/ BILATERAL URETEROSCOPY, LASER LITHOTRIPSY/STONE BASKETRY;  Surgeon: Alexis Frock, MD;  Location: [facility];  Service: Urology;  Laterality: Bilateral;  . EXCISION CYST MANDIBLE  1989  . HOLMIUM LASER APPLICATION Left 9/89/2119   Procedure: HOLMIUM LASER APPLICATION;  Surgeon: Alexis Frock, MD;  Location: [facility];  Service: Urology;  Laterality: Left;  . NASAL FRACTURE SURGERY  2002  . TONSILLECTOMY  2008  . TUBAL LIGATION  2004   PPTL   Social History   Occupational History  . Not on file  Tobacco Use  . Smoking status: Never Smoker  . Smokeless tobacco: Never Used  Substance and Sexual Activity  . Alcohol use: No  . Drug use: No  . Sexual activity: Not on file

## 2018-06-07 ENCOUNTER — Other Ambulatory Visit: Payer: Self-pay | Admitting: Internal Medicine

## 2018-06-07 DIAGNOSIS — R011 Cardiac murmur, unspecified: Secondary | ICD-10-CM

## 2018-06-14 ENCOUNTER — Other Ambulatory Visit: Payer: Self-pay

## 2018-06-14 ENCOUNTER — Ambulatory Visit (INDEPENDENT_AMBULATORY_CARE_PROVIDER_SITE_OTHER): Payer: Managed Care, Other (non HMO)

## 2018-06-14 DIAGNOSIS — R011 Cardiac murmur, unspecified: Secondary | ICD-10-CM

## 2019-04-15 ENCOUNTER — Other Ambulatory Visit: Payer: Managed Care, Other (non HMO)

## 2019-06-15 ENCOUNTER — Ambulatory Visit: Payer: Self-pay | Admitting: Orthopedic Surgery

## 2020-05-01 IMAGING — MR MR LUMBAR SPINE W/O CM
5 series · 46 of 48 positions shown · non-contrast
Comparison: Lumbar radiographs dated 07/27/2017

CLINICAL DATA: Low back pain and right leg pain.

EXAM:
MRI LUMBAR SPINE WITHOUT CONTRAST
TECHNIQUE: Multiplanar, multisequence MR imaging of the lumbar spine was
performed. No intravenous contrast was administered.

[Series 3: T2 · sagittal · 4.0mm · 0.88mm/px · 6 of 15 slices shown (1 of 2)]
[im 1/15]
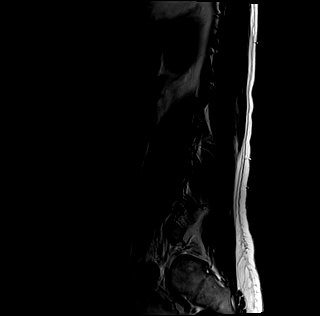
[im 3/15]
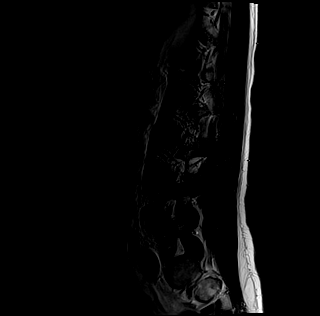
[im 6/15]
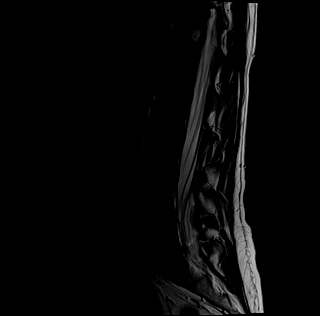
[im 9/15]
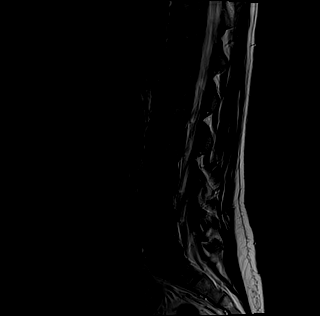
[im 12/15]
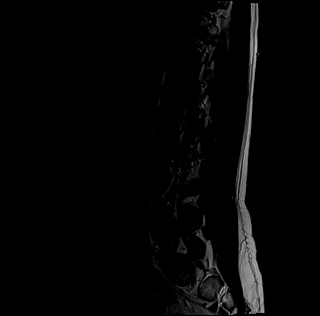
[im 15/15]
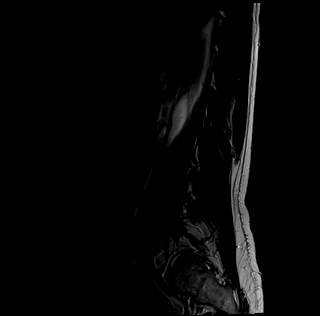

[Series 4: tirm sag · sagittal · 4.0mm · 0.55mm/px · 6 of 15 slices shown]
[im 1/15]
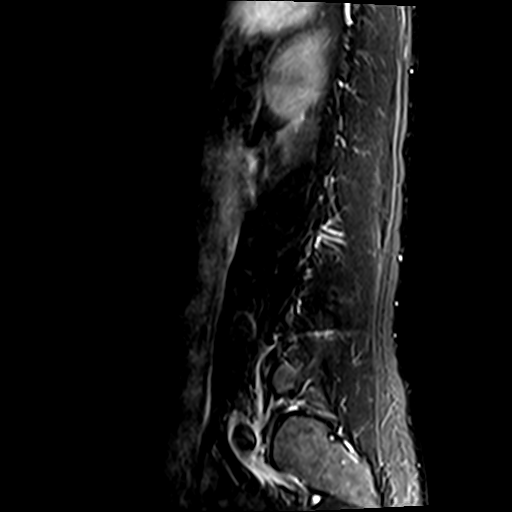
[im 3/15]
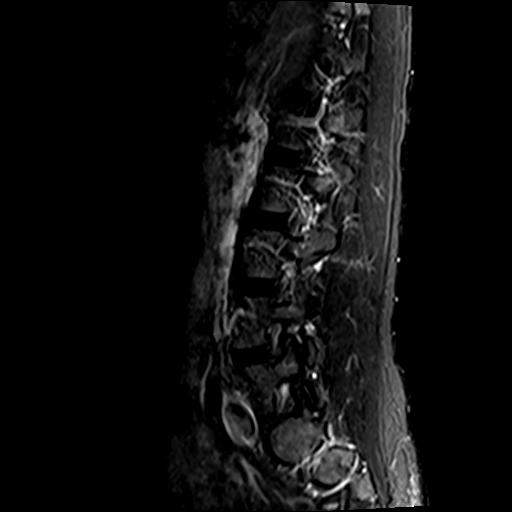
[im 6/15]
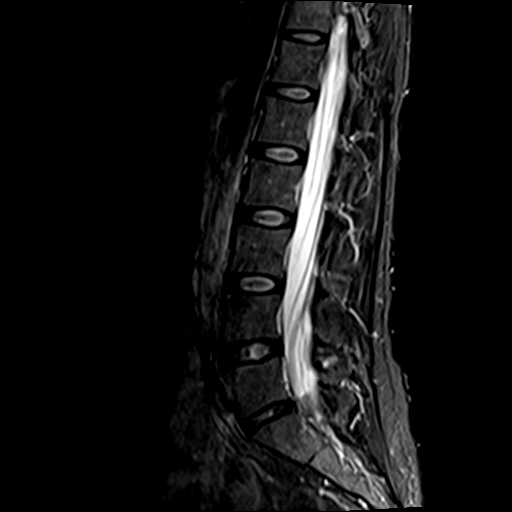
[im 9/15]
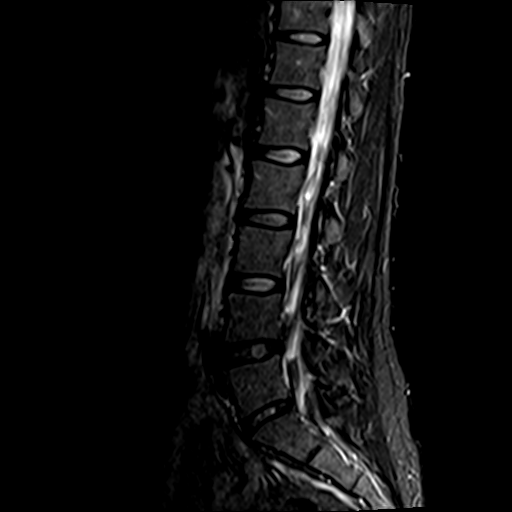
[im 12/15]
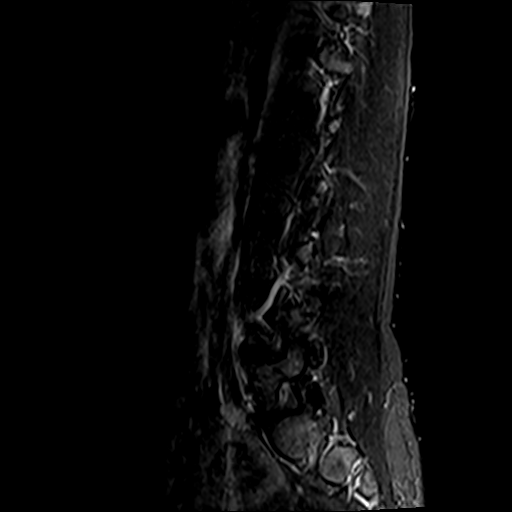
[im 15/15]
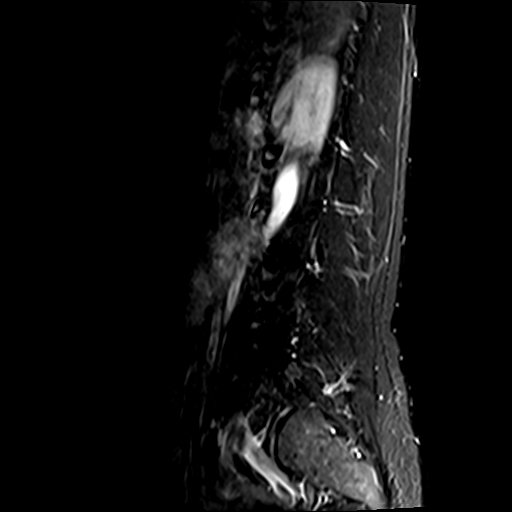

[Series 5: T1 · sagittal · 4.0mm · 0.88mm/px · 6 of 15 slices shown (1 of 2)]
[im 1/15]
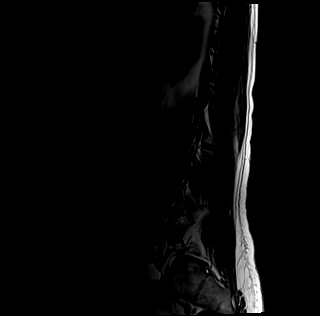
[im 3/15]
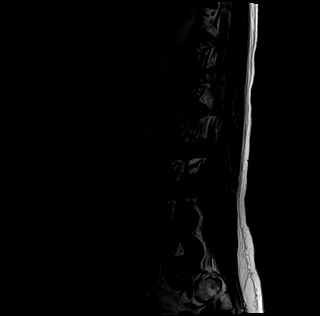
[im 6/15]
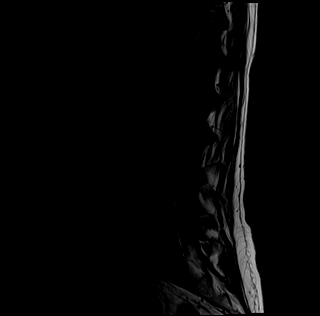
[im 9/15]
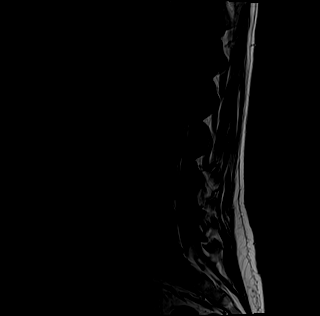
[im 12/15]
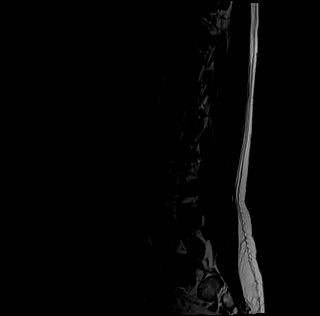
[im 15/15]
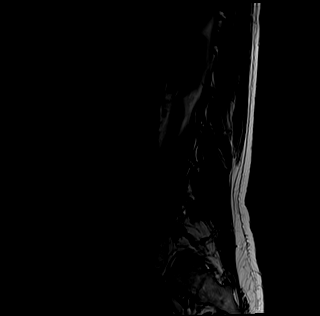

[Series 6: T2 · axial · 4.0mm · 0.70mm/px · z∈[-69,+143]mm · 15 of 41 slices shown (2 of 2)]
[im 1/41]
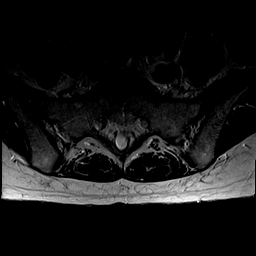
[im 3/41]
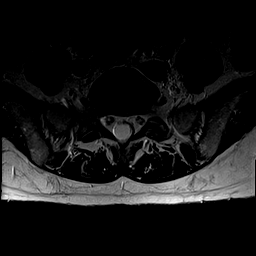
[im 6/41]
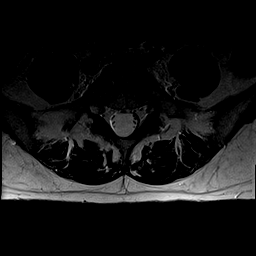
[im 9/41]
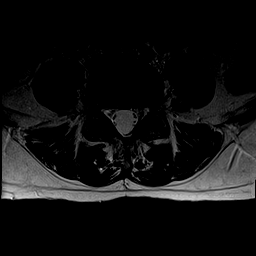
[im 12/41]
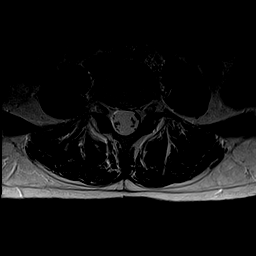
[im 15/41]
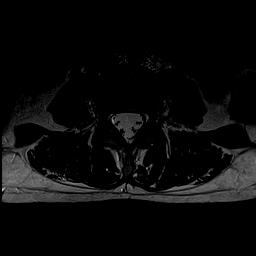
[im 18/41]
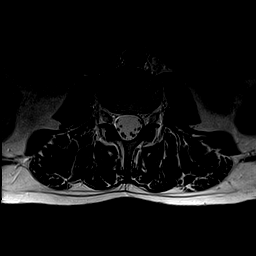
[im 21/41]
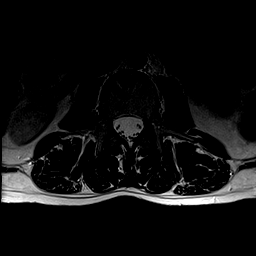
[im 23/41]
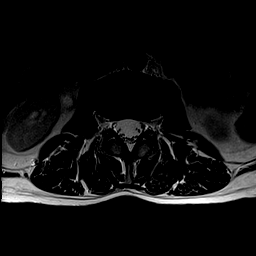
[im 26/41]
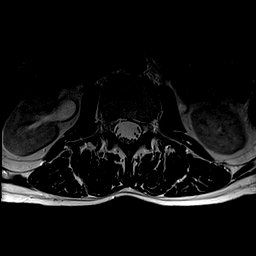
[im 29/41]
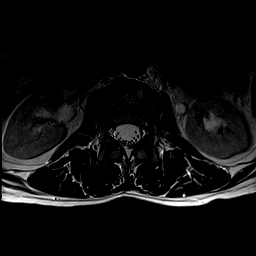
[im 32/41]
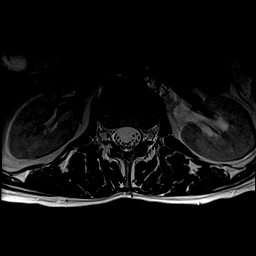
[im 35/41]
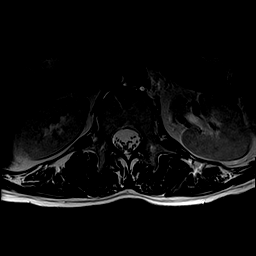
[im 38/41]
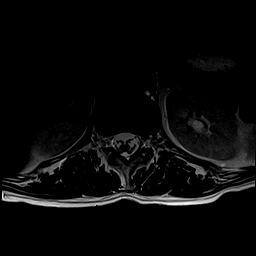
[im 41/41]
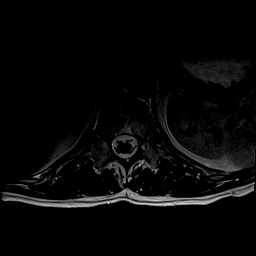

[Series 7: T1 · axial · 4.0mm · 0.70mm/px · z∈[-69,+143]mm · 13 of 41 slices shown (2 of 2)]
[im 1/41]
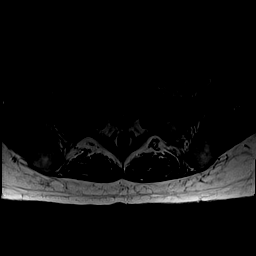
[im 3/41]
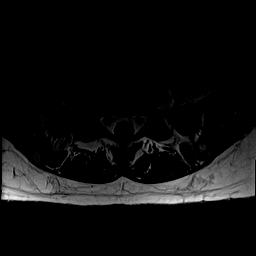
[im 6/41]
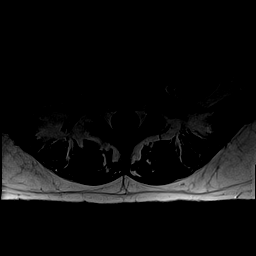
[im 9/41]
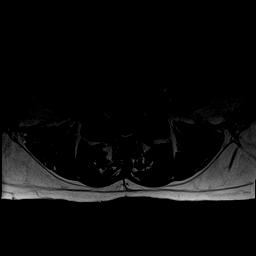
[im 12/41]
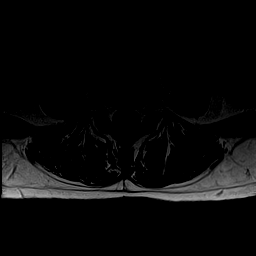
[im 15/41]
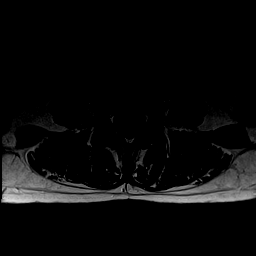
[im 18/41]
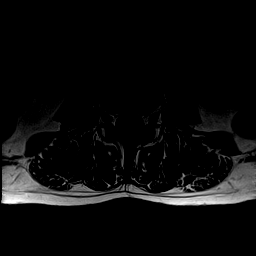
[im 21/41]
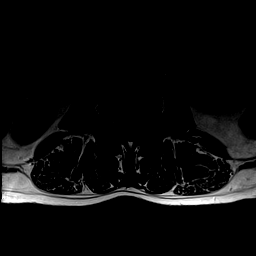
[im 23/41]
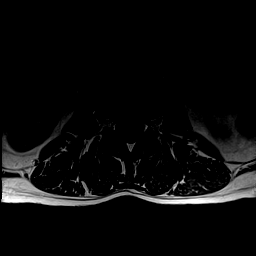
[im 26/41]
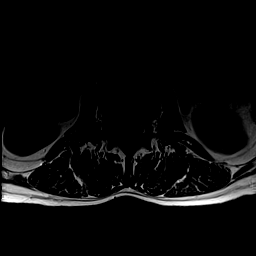
[im 29/41]
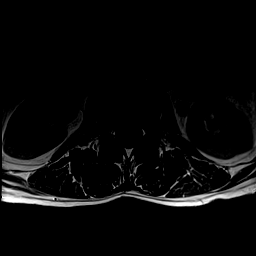
[im 35/41]
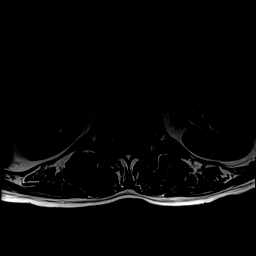
[im 41/41]
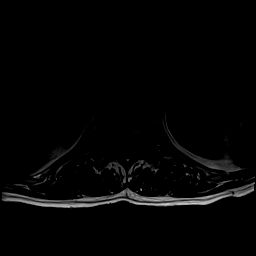

[46 of 48 positions shown; findings below may reference images not displayed]

FINDINGS: Segmentation:  Standard.

Alignment:  Physiologic.

Vertebrae:  No fracture, evidence of discitis, or bone lesion.

Conus medullaris and cauda equina: Conus extends to the L1 level.
Conus and cauda equina appear normal.

Paraspinal and other soft tissues: Negative.

Disc levels:

L1-2: Normal.

L2-3: Normal.

L3-4: Normal.

L4-5: Very tiny broad-based disc bulge with no neural impingement.
Otherwise normal.

L5-S1: Normal.

No spinal or foraminal stenosis. No facet arthritis in the lumbar
spine.
IMPRESSION: No significant abnormality of the lumbar spine.

## 2021-06-22 DIAGNOSIS — N200 Calculus of kidney: Secondary | ICD-10-CM | POA: Insufficient documentation

## 2021-08-22 ENCOUNTER — Other Ambulatory Visit: Payer: Self-pay | Admitting: Urology

## 2021-08-22 ENCOUNTER — Encounter (HOSPITAL_BASED_OUTPATIENT_CLINIC_OR_DEPARTMENT_OTHER): Payer: Self-pay | Admitting: Urology

## 2021-08-22 NOTE — Anesthesia Preprocedure Evaluation (Signed)
Anesthesia Evaluation Anesthesia Physical Anesthesia Plan  ASA:   Anesthesia Plan: General   Post-op Pain Management:    Induction:   PONV Risk Score and Plan:   Airway Management Planned:   Additional Equipment:   Intra-op Plan:   Post-operative Plan: Extubation in OR  Informed Consent:   Plan Discussed with: Anesthesiologist  Anesthesia Plan Comments:         Anesthesia Quick Evaluation

## 2021-08-23 ENCOUNTER — Ambulatory Visit (HOSPITAL_BASED_OUTPATIENT_CLINIC_OR_DEPARTMENT_OTHER): Payer: Managed Care, Other (non HMO) | Admitting: Anesthesiology

## 2021-08-23 ENCOUNTER — Encounter (HOSPITAL_BASED_OUTPATIENT_CLINIC_OR_DEPARTMENT_OTHER): Payer: Self-pay | Admitting: Urology

## 2021-08-23 ENCOUNTER — Encounter (HOSPITAL_BASED_OUTPATIENT_CLINIC_OR_DEPARTMENT_OTHER)
Admission: RE | Disposition: A | Discharge status: Home / Self Care | Payer: Self-pay | Source: Home / Self Care | Attending: Urology

## 2021-08-23 ENCOUNTER — Ambulatory Visit (HOSPITAL_BASED_OUTPATIENT_CLINIC_OR_DEPARTMENT_OTHER)
Admission: RE | Admit: 2021-08-23 | Discharge: 2021-08-23 | Disposition: A | Discharge status: Home / Self Care | Payer: Managed Care, Other (non HMO) | Attending: Urology | Admitting: Urology

## 2021-08-23 DIAGNOSIS — K219 Gastro-esophageal reflux disease without esophagitis: Secondary | ICD-10-CM | POA: Diagnosis not present

## 2021-08-23 DIAGNOSIS — Z01818 Encounter for other preprocedural examination: Secondary | ICD-10-CM

## 2021-08-23 DIAGNOSIS — N289 Disorder of kidney and ureter, unspecified: Secondary | ICD-10-CM | POA: Diagnosis not present

## 2021-08-23 DIAGNOSIS — N132 Hydronephrosis with renal and ureteral calculous obstruction: Secondary | ICD-10-CM | POA: Diagnosis present

## 2021-08-23 DIAGNOSIS — N201 Calculus of ureter: Secondary | ICD-10-CM | POA: Diagnosis not present

## 2021-08-23 HISTORY — PX: CYSTOSCOPY WITH RETROGRADE PYELOGRAM, URETEROSCOPY AND STENT PLACEMENT: SHX5789

## 2021-08-23 HISTORY — PX: HOLMIUM LASER APPLICATION: SHX5852

## 2021-08-23 HISTORY — DX: Gastro-esophageal reflux disease without esophagitis: K21.9

## 2021-08-23 HISTORY — DX: Other complications of anesthesia, initial encounter: T88.59XA

## 2021-08-23 HISTORY — DX: Personal history of COVID-19: Z86.16

## 2021-08-23 LAB — POCT PREGNANCY, URINE: Preg Test, Ur: NEGATIVE

## 2021-08-23 SURGERY — CYSTOURETEROSCOPY, WITH RETROGRADE PYELOGRAM AND STENT INSERTION
Anesthesia: General | Site: Renal | Laterality: Left

## 2021-08-23 MED ORDER — HYDROMORPHONE HCL 1 MG/ML IJ SOLN
INTRAMUSCULAR | Status: AC
  Filled 2021-08-23: qty 1

## 2021-08-23 MED ORDER — LIDOCAINE 2% (20 MG/ML) 5 ML SYRINGE
INTRAMUSCULAR | Status: DC | PRN
  Administered 2021-08-23: 60 mg via INTRAVENOUS

## 2021-08-23 MED ORDER — SENNOSIDES-DOCUSATE SODIUM 8.6-50 MG PO TABS: 1.0000 | ORAL_TABLET | Freq: Two times a day (BID) | ORAL | 0 refills | Status: DC

## 2021-08-23 MED ORDER — CEPHALEXIN 500 MG PO CAPS: 500.0000 mg | ORAL_CAPSULE | Freq: Two times a day (BID) | ORAL | 0 refills | Status: AC

## 2021-08-23 MED ORDER — LIDOCAINE HCL (PF) 2 % IJ SOLN
INTRAMUSCULAR | Status: AC
  Filled 2021-08-23: qty 5

## 2021-08-23 MED ORDER — 0.9 % SODIUM CHLORIDE (POUR BTL) OPTIME
TOPICAL | Status: DC | PRN
  Administered 2021-08-23: 500 mL

## 2021-08-23 MED ORDER — PHENYLEPHRINE 80 MCG/ML (10ML) SYRINGE FOR IV PUSH (FOR BLOOD PRESSURE SUPPORT)
PREFILLED_SYRINGE | INTRAVENOUS | Status: DC | PRN
  Administered 2021-08-23: 160 ug via INTRAVENOUS
  Administered 2021-08-23: 80 ug via INTRAVENOUS
  Administered 2021-08-23: 160 ug via INTRAVENOUS

## 2021-08-23 MED ORDER — PHENYLEPHRINE 80 MCG/ML (10ML) SYRINGE FOR IV PUSH (FOR BLOOD PRESSURE SUPPORT)
PREFILLED_SYRINGE | INTRAVENOUS | Status: AC
  Filled 2021-08-23: qty 10

## 2021-08-23 MED ORDER — FENTANYL CITRATE (PF) 100 MCG/2ML IJ SOLN
INTRAMUSCULAR | Status: DC | PRN
  Administered 2021-08-23: 50 ug via INTRAVENOUS

## 2021-08-23 MED ORDER — ONDANSETRON HCL 4 MG/2ML IJ SOLN
INTRAMUSCULAR | Status: AC
  Filled 2021-08-23: qty 2

## 2021-08-23 MED ORDER — PROPOFOL 10 MG/ML IV BOLUS
INTRAVENOUS | Status: AC
  Filled 2021-08-23: qty 20

## 2021-08-23 MED ORDER — MIDAZOLAM HCL 2 MG/2ML IJ SOLN
INTRAMUSCULAR | Status: AC
  Filled 2021-08-23: qty 2

## 2021-08-23 MED ORDER — PROPOFOL 10 MG/ML IV BOLUS
INTRAVENOUS | Status: DC | PRN
  Administered 2021-08-23: 200 mg via INTRAVENOUS

## 2021-08-23 MED ORDER — FENTANYL CITRATE (PF) 100 MCG/2ML IJ SOLN
INTRAMUSCULAR | Status: AC
  Filled 2021-08-23: qty 2

## 2021-08-23 MED ORDER — IOHEXOL 300 MG/ML  SOLN
INTRAMUSCULAR | Status: DC | PRN
  Administered 2021-08-23: 6 mL via URETHRAL

## 2021-08-23 MED ORDER — CEFAZOLIN SODIUM-DEXTROSE 2-4 GM/100ML-% IV SOLN
INTRAVENOUS | Status: AC
  Filled 2021-08-23: qty 100

## 2021-08-23 MED ORDER — DEXAMETHASONE SODIUM PHOSPHATE 10 MG/ML IJ SOLN
INTRAMUSCULAR | Status: AC
  Filled 2021-08-23: qty 1

## 2021-08-23 MED ORDER — ONDANSETRON HCL 4 MG/2ML IJ SOLN
INTRAMUSCULAR | Status: DC | PRN
  Administered 2021-08-23: 4 mg via INTRAVENOUS

## 2021-08-23 MED ORDER — HYDROMORPHONE HCL 1 MG/ML IJ SOLN
0.2500 mg | INTRAMUSCULAR | Status: DC | PRN
  Administered 2021-08-23: 0.5 mg via INTRAVENOUS

## 2021-08-23 MED ORDER — MIDAZOLAM HCL 5 MG/5ML IJ SOLN
INTRAMUSCULAR | Status: DC | PRN
  Administered 2021-08-23: 2 mg via INTRAVENOUS

## 2021-08-23 MED ORDER — LACTATED RINGERS IV SOLN: INTRAVENOUS | Status: DC

## 2021-08-23 MED ORDER — DEXAMETHASONE SODIUM PHOSPHATE 10 MG/ML IJ SOLN
INTRAMUSCULAR | Status: DC | PRN
  Administered 2021-08-23: 10 mg via INTRAVENOUS

## 2021-08-23 MED ORDER — SODIUM CHLORIDE 0.9 % IR SOLN
Status: DC | PRN
  Administered 2021-08-23: 1500 mL

## 2021-08-23 MED ORDER — OXYCODONE-ACETAMINOPHEN 5-325 MG PO TABS: 1.0000 | ORAL_TABLET | Freq: Four times a day (QID) | ORAL | 0 refills | Status: DC | PRN

## 2021-08-23 MED ORDER — CEFAZOLIN SODIUM-DEXTROSE 2-4 GM/100ML-% IV SOLN
2.0000 g | INTRAVENOUS | Status: AC
  Administered 2021-08-23: 2 g via INTRAVENOUS

## 2021-08-23 MED ORDER — KETOROLAC TROMETHAMINE 10 MG PO TABS
10.0000 mg | ORAL_TABLET | Freq: Three times a day (TID) | ORAL | 0 refills | Status: DC | PRN
Start: 2021-08-23 — End: 2022-05-07

## 2021-08-23 SURGICAL SUPPLY — 26 items
BAG DRAIN URO-CYSTO SKYTR STRL (DRAIN) ×2 IMPLANT
BAG DRN UROCATH (DRAIN) ×1
BASKET LASER NITINOL 1.9FR (BASKET) ×1 IMPLANT
BSKT STON RTRVL 120 1.9FR (BASKET) ×1
CATH INTERMIT  6FR 70CM (CATHETERS) ×1 IMPLANT
CLOTH BEACON ORANGE TIMEOUT ST (SAFETY) ×2 IMPLANT
FIBER LASER FLEXIVA 365 (UROLOGICAL SUPPLIES) IMPLANT
GLOVE BIO SURGEON STRL SZ7.5 (GLOVE) ×2 IMPLANT
GOWN STRL REUS W/TWL LRG LVL3 (GOWN DISPOSABLE) ×2 IMPLANT
GUIDEWIRE ANG ZIPWIRE 038X150 (WIRE) ×2 IMPLANT
GUIDEWIRE STR DUAL SENSOR (WIRE) ×2 IMPLANT
IV NS 1000ML (IV SOLUTION)
IV NS 1000ML BAXH (IV SOLUTION) ×1 IMPLANT
IV NS IRRIG 3000ML ARTHROMATIC (IV SOLUTION) ×2 IMPLANT
KIT TURNOVER CYSTO (KITS) ×2 IMPLANT
MANIFOLD NEPTUNE II (INSTRUMENTS) ×2 IMPLANT
NS IRRIG 500ML POUR BTL (IV SOLUTION) ×2 IMPLANT
PACK CYSTO (CUSTOM PROCEDURE TRAY) ×2 IMPLANT
SOL PREP POV-IOD 4OZ 10% (MISCELLANEOUS) ×1 IMPLANT
STENT POLARIS 5FRX22 (STENTS) ×1 IMPLANT
SYR 10ML LL (SYRINGE) ×2 IMPLANT
TRACTIP FLEXIVA PULS ID 200XHI (Laser) IMPLANT
TRACTIP FLEXIVA PULSE ID 200 (Laser) ×2
TUBE CONNECTING 12X1/4 (SUCTIONS) ×2 IMPLANT
TUBE FEEDING 8FR 16IN STR KANG (MISCELLANEOUS) ×1 IMPLANT
TUBING UROLOGY SET (TUBING) ×2 IMPLANT

## 2021-08-23 NOTE — Anesthesia Procedure Notes (Signed)
Procedure Name: LMA Insertion Date/Time: 08/23/2021 7:42 AM Performed by: Rogers Blocker, CRNA Pre-anesthesia Checklist: Patient identified, Emergency Drugs available, Suction available and Patient being monitored Patient Re-evaluated:Patient Re-evaluated prior to induction Oxygen Delivery Method: Circle System Utilized Preoxygenation: Pre-oxygenation with 100% oxygen Induction Type: IV induction Ventilation: Mask ventilation without difficulty LMA: LMA inserted LMA Size: 3.0 Number of attempts: 2 (attempted size 4, could not seat) Placement Confirmation: positive ETCO2 Tube secured with: Tape Dental Injury: Teeth and Oropharynx as per pre-operative assessment

## 2021-08-23 NOTE — Brief Op Note (Signed)
08/23/2021  8:04 AM  PATIENT:  Theresa Solis  51 y.o. female  PRE-OPERATIVE DIAGNOSIS:  LEFT URETERAL CALCULUS  POST-OPERATIVE DIAGNOSIS:  LEFT URETERAL CALCULUS  PROCEDURE:  Procedure(s) with comments: CYSTOSCOPY WITH RETROGRADE PYELOGRAM, URETEROSCOPY AND STENT PLACEMENT (Left) - 1 HR HOLMIUM LASER APPLICATION (Left)  SURGEON:  Surgeon(s) and Role:    Alexis Frock, MD - Primary  PHYSICIAN ASSISTANT:   ASSISTANTS: none   ANESTHESIA:   general  EBL:  minimal   BLOOD ADMINISTERED:none  DRAINS: none   LOCAL MEDICATIONS USED:  NONE  SPECIMEN:  Source of Specimen:  left ureteral stone fragments  DISPOSITION OF SPECIMEN:   [facility] for compositional analysis  COUNTS:  YES  TOURNIQUET:  * No tourniquets in log *  DICTATION: .Other Dictation: Dictation Number [id number]  PLAN OF CARE: Discharge to home after PACU  PATIENT DISPOSITION:  PACU - hemodynamically stable.   Delay start of Pharmacological VTE agent (>24hrs) due to surgical blood loss or risk of bleeding: yes

## 2021-08-23 NOTE — Transfer of Care (Signed)
Immediate Anesthesia Transfer of Care Note  Patient: Theresa Solis  Procedure(s) Performed: CYSTOSCOPY WITH RETROGRADE PYELOGRAM, URETEROSCOPY AND STENT PLACEMENT (Left: Renal) HOLMIUM LASER APPLICATION (Left: Renal)  Patient Location: PACU  Anesthesia Type:General  Level of Consciousness: awake, alert , oriented and patient cooperative  Airway & Oxygen Therapy: Patient Spontanous Breathing  Post-op Assessment: Report given to RN and Post -op Vital signs reviewed and stable  Post vital signs: Reviewed and stable  Last Vitals:  Vitals Value Taken Time  BP 114/76 08/23/21 0815  Temp 36.4 C 08/23/21 0814  Pulse 86 08/23/21 0815  Resp 19 08/23/21 0815  SpO2 97 % 08/23/21 0815  Vitals shown include unvalidated device data.  Last Pain:  Vitals:   08/23/21 0542  TempSrc: Oral  PainSc: 3       Patients Stated Pain Goal: 8 (40/98/11 9147)  Complications: No notable events documented.

## 2021-08-23 NOTE — H&P (Signed)
Theresa Solis is an 51 y.o. female.    Chief Complaint: Pre-OP LEFT Ureteroscopic Stone Manipulation  HPI:   1 - Nephrolithiasis -  05/2014 - bilateral URS to stone free for Rt ureteral, Lt renal stones    Recent Surveillance:  08/2017 KUB,RUS - stable punctate stones 58m x1 each.  12/2018 KUB, RUS - stable punctate Rt stones and Lt mid [id number].  12/2019 KUB, RUS - stable punctate Rt stones and Lt mid  12/2020 KUB, RUS - stable punctate bilateral renal stones, ? 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    PMH sig for ENT surgery. NO CV disease. No blood thinners. Her PCP is Dr. RaAshby Solis   Today " Theresa Solis is seen to proceed with LEFT ureteroscopic stone manipulation for 40m33molitary UVJ stone that is persistant x few mos and now with colic symptoms. No interval fevers.     Past Medical History:  Diagnosis Date   Complication of anesthesia    bp drops a little, did ok with 2016 surgery   GERD (gastroesophageal reflux disease)    History of COVID-19    2021 and 2022   History of gastroesophageal reflux (GERD)    Nephrolithiasis    BILATERAL   Wears contact lenses     Past Surgical History:  Procedure Laterality Date   CYSTOSCOPY WITH RETROGRADE PYELOGRAM, URETEROSCOPY AND STENT PLACEMENT Bilateral 06/08/2014   Procedure: CYSTOSCOPY WITH BILATERAL RETROGRADE PYELOGRAM/ BILATERAL URETERAL STENT PLACEMENT/ BILATERAL URETEROSCOPY, LASER LITHOTRIPSY/STONE BASKETRY;  Surgeon: TheAlexis FrockD;  [facility];  Laterality: Bilateral;   dental implants  2018   ENDOMETRIAL ABLATION  2018   for fibroids   EXCISION CYST MANDIBLE  03/25/1987   HOLMIUM LASER APPLICATION Left  03/32/12/2482Procedure: HOLMIUM LASER APPLICATION;  Surgeon: TheAlexis FrockD;  [facility]  Laterality: Left;   NASAL FRACTURE SURGERY  03/24/2000   TONSILLECTOMY  03/24/2006   TUBAL LIGATION  03/24/2002   PPTL   vulvar cyst removed     10 to 15 yrs ago per pt on 08-22-2021    History reviewed. No pertinent family history. Social History:  reports that she has never smoked. She has never used smokeless tobacco. She reports current alcohol use. She reports that she does not use drugs.  Allergies:  Allergies  Allergen Reactions   Codeine Nausea And Vomiting   Tetracyclines & Related Nausea And Vomiting    Medications Prior to Admission  Medication Sig Dispense Refill   AZO-CRANBERRY PO Take 2 tablets by mouth daily.     Esomeprazole Magnesium (NEXIUM PO) Take by mouth as needed.     ibuprofen (ADVIL) 200 MG tablet Take 400 mg by mouth every 6 (six) hours as needed.     loratadine (CLARITIN) 10 MG tablet Take 5 mg by mouth daily.     OVER THE COUNTER MEDICATION Plant based omega  juice plus 2 tabs daily     OVER THE COUNTER MEDICATION Osteo biflex 2 tabs daily     Probiotic Product (PROBIOTIC DAILY PO) Take 1 tablet by mouth daily.     tamsulosin (FLOMAX) 0.4 MG CAPS capsule Take 0.4 mg  by mouth daily after supper.      Results for orders placed or performed during the hospital encounter of 08/23/21 (from the past 48 hour(s))  Pregnancy, urine POC     Status: None   Collection Time: 08/23/21  5:38 AM  Result Value Ref Range   Preg Test, Ur NEGATIVE NEGATIVE    Comment:        THE SENSITIVITY OF THIS METHODOLOGY IS >24 mIU/mL    No results found.  Review of Systems  Constitutional:  Negative for chills and fever.  Genitourinary:  Positive for flank pain and urgency.  All other systems reviewed and are negative.  Blood pressure 123/81, pulse (!) 51, temperature (!) 96.8 F (36 C), temperature source Oral, resp. rate 17, height  5' 8"  (1.727 m), weight 75.2 kg, last menstrual period 08/15/2021, SpO2 97 %. Physical Exam Vitals reviewed.  Constitutional:      Comments: Talkative, at baseline.   HENT:     Right Ear: Tympanic membrane normal.  Eyes:     Pupils: Pupils are equal, round, and reactive to light.  Cardiovascular:     Rate and Rhythm: Normal rate.  Pulmonary:     Effort: Pulmonary effort is normal.  Abdominal:     General: Abdomen is flat.  Genitourinary:    Comments: Mild left CVAT at present Musculoskeletal:        General: Normal range of motion.     Cervical back: Normal range of motion.  Skin:    General: Skin is warm.  Neurological:     General: No focal deficit present.     Mental Status: She is alert.     Assessment/Plan  Proceed as planned with LEFT ureteroscopic stone manipulation. Risks, benefits, alternatives, expected peri-op course discussed previously and reiterated today.   Alexis Frock, MD 08/23/2021, 6:15 AM

## 2021-08-23 NOTE — Addendum Note (Signed)
Addendum  created 08/23/21 1443 by Rogers Blocker, CRNA   Charge Capture section accepted

## 2021-08-23 NOTE — Anesthesia Postprocedure Evaluation (Signed)
Anesthesia Post Note  Patient: HILDEGARD HLAVAC  Procedure(s) Performed: CYSTOSCOPY WITH RETROGRADE PYELOGRAM, URETEROSCOPY AND STENT PLACEMENT (Left: Renal) HOLMIUM LASER APPLICATION (Left: Renal)     Patient location during evaluation: PACU Anesthesia Type: General Level of consciousness: awake Pain management: pain level controlled Vital Signs Assessment: post-procedure vital signs reviewed and stable Respiratory status: spontaneous breathing Cardiovascular status: stable Postop Assessment: no apparent nausea or vomiting Anesthetic complications: no   No notable events documented.  Last Vitals:  Vitals:   08/23/21 0850 08/23/21 0851  BP:    Pulse: 72 70  Resp: 17 (!) 23  Temp: (!) 36.4 C (!) 36.4 C  SpO2: 98% 97%    Last Pain:  Vitals:   08/23/21 0851  TempSrc: Oral  PainSc:                  Ashlinn Hemrick

## 2021-08-23 NOTE — Discharge Instructions (Addendum)
1 - You may have urinary urgency (bladder spasms) and bloody urine on / off with stent in place. This is normal.  2 - Remove tethered stent on Monday morning at home by pulling on string, then blue-white plastic tubing, and discarding. Office is open Monday if any problems arise.   3 - Call MD or go to ER for fever >102, severe pain / nausea / vomiting not relieved by medications, or acute change in medical status  Post Anesthesia Home Care Instructions  Activity: Get plenty of rest for the remainder of the day. A responsible adult should stay with you for 24 hours following the procedure.  For the next 24 hours, DO NOT: -Drive a car -Paediatric nurse -Drink alcoholic beverages -Take any medication unless instructed by your physician -Make any legal decisions or sign important papers.  Meals: Start with liquid foods such as gelatin or soup. Progress to regular foods as tolerated. Avoid greasy, spicy, heavy foods. If nausea and/or vomiting occur, drink only clear liquids until the nausea and/or vomiting subsides. Call your physician if vomiting continues.  Special Instructions/Symptoms: Your throat may feel dry or sore from the anesthesia or the breathing tube placed in your throat during surgery. If this causes discomfort, gargle with warm salt water. The discomfort should disappear within 24 hours.

## 2021-08-23 NOTE — Op Note (Signed)
NAME: Theresa Solis, Theresa Solis MEDICAL RECORD NO: [id number] ACCOUNT NO: [id number] DATE OF BIRTH: 1970/12/09 FACILITY: [facility] LOCATION: [facility] PHYSICIAN: Alexis Frock, MD  Operative Report   DATE OF PROCEDURE: 08/23/2021  SURGEON:  Alexis Frock, MD  PREOPERATIVE DIAGNOSIS:  Left ureteral stone.  PROCEDURE PERFORMED: 1.  Cystoscopy, left retrograde pyelogram and interpretation. 2.  Left ureteroscopy with laser lithotripsy. 3.  Insertion of left ureteral stent.  ESTIMATED BLOOD LOSS:  Nil.  COMPLICATIONS:  None.  SPECIMEN:  Left ureteral stone for composition analysis.  FINDINGS: 1.  Left distal ureteral stone, mild hydronephrosis. 2.  Complete resolution of all accessible stone fragments larger than one-third mm following laser lithotripsy and basket extraction. 3.  Successful placement of left ureteral stent, proximal end in the renal pelvis, distal end in urinary bladder with tether.  INDICATIONS:  The patient is a very pleasant 51 year old lady with long history of recurrent urolithiasis.  She is compliant with medical therapy to reduce her risk.  She has a known left distal ureteral stone that has been present for several months.   She has been on medical therapy for this, but has failed to pass her stone.  She has been increasingly bothered by mostly irritative and low pelvic symptoms without infectious parameters consistent with colic from her low stone.  Options were discussed  for management including continued medical therapy versus shockwave lithotripsy versus ureteroscopy with the latter being preferred given the stone location as a goal of stone free.  Informed consent was obtained and placed in medical record.  PROCEDURE IN DETAIL:  The patient being Theresa Solis verified and procedure being left ureteroscopic stone manipulation was confirmed.  Procedure timeout was performed.  Intravenous antibiotics were administered and general LMA anesthesia induced.  The  patient  was placed into a low lithotomy position.  Sterile field was created, prepped and draped the patient's vagina, introitus, and proximal thighs using iodine.  Cystourethroscopy was performed using 21-French rigid cystoscope with offset lens.   Inspection of urinary bladder revealed some mild mucosal edema at the area of the left ureteral orifice. Stone was not visible, but the edema suggested location in the intramural ureter.  The left ureteral orifice was cannulated with a 6-French end-hole  catheter, and a left retrograde pyelogram was obtained.  Left retrograde pyelogram demonstrated a single left ureter, single system left kidney and very mild hydronephrosis of the distal ureter ____ filling defect was consistent with known stone.  A ZIPwire was advanced to the level of the upper pole, set  aside as a safety wire.  An 8-French feeding tube placed in the urinary bladder for pressure release and semirigid ureteroscopy was performed of the distal ureter alongside as separate sensor working wire.  As anticipated approximately 2 cm within the  intramural ureter, stone in question was encountered.  It was somewhat fusiform, but too large for simple basketing.  As such, holmium laser energy applied to the stone using settings of 0.2 joules and 20 Hz.  Approximately 30% of the stone was dusted,  70% fragmented with the fragments being amenable to simple basketing with the Escape basket, they were removed and set aside for composition analysis.  Following this, complete resolution of all accessible stone fragments in the distal two-thirds of left  ureter. Notably, this was her only stone based on preoperative imaging.  We achieved the goals of surgery today. Given the relative chronicity of the stone, it was felt that brief interval stenting with tethered stent would be most  prudent to allow for  resolution of mucosal edema and a new 5 x 22 Polaris type stent was placed over the remaining safety wire using  fluoroscopic guidance.  Good proximal and distal planes were noted.  Tether was left in place, trimmed to length and tucked per vagina and the  procedure was terminated.  The patient tolerated the procedure well, no immediate periprocedural complications.  The patient was taken to postanesthesia care unit in stable condition.  Plan for discharge home.   NIK D: 08/23/2021 8:09:10 am T: 08/23/2021 11:07:00 am  JOB: [id number]/ [id number]

## 2021-08-26 ENCOUNTER — Encounter (HOSPITAL_BASED_OUTPATIENT_CLINIC_OR_DEPARTMENT_OTHER): Payer: Self-pay | Admitting: Urology

## 2022-03-26 ENCOUNTER — Other Ambulatory Visit: Payer: Self-pay | Admitting: Obstetrics and Gynecology

## 2022-03-26 DIAGNOSIS — Z1231 Encounter for screening mammogram for malignant neoplasm of breast: Secondary | ICD-10-CM

## 2022-05-07 ENCOUNTER — Encounter: Payer: Self-pay | Admitting: Obstetrics and Gynecology

## 2022-05-07 ENCOUNTER — Ambulatory Visit (INDEPENDENT_AMBULATORY_CARE_PROVIDER_SITE_OTHER): Payer: Managed Care, Other (non HMO) | Admitting: Obstetrics and Gynecology

## 2022-05-07 VITALS — BP 110/60 | HR 66 | Ht 68.0 in | Wt 163.0 lb

## 2022-05-07 DIAGNOSIS — Z01419 Encounter for gynecological examination (general) (routine) without abnormal findings: Secondary | ICD-10-CM | POA: Diagnosis not present

## 2022-05-07 DIAGNOSIS — N760 Acute vaginitis: Secondary | ICD-10-CM

## 2022-05-07 LAB — WET PREP FOR TRICH, YEAST, CLUE

## 2022-05-07 MED ORDER — BETAMETHASONE VALERATE 0.1 % EX OINT: 1.0000 | TOPICAL_OINTMENT | Freq: Two times a day (BID) | CUTANEOUS | 0 refills | Status: AC

## 2022-05-07 NOTE — Progress Notes (Signed)
52 y.o. G61P2002 Married White or Caucasian Not Hispanic or Latino female here for annual exam and establish. H/O endometrial ablation. No dyspareunia. No vasomotor symptoms.  Period Cycle (Days): 28 Period Duration (Days): 1-2 Period Pattern: Regular Menstrual Flow: Light Menstrual Control: Panty liner  H/O GSI with a full bladder, tolerable. She wears a liner.   Recently she has had some vulvar irritation and burning, thinks she is irritated from her pads and wearing jeans. No c/o increased d/c.   She has some issues with constipation. Takes a stool softener, sometimes takes miralax.   Patient's last menstrual period was 04/09/2022.          Sexually active: Yes.    The current method of family planning is tubal ligation.    Exercising: Yes.     Walking the dog.  Smoker:  no  Health Maintenance: Pap 04/26/20 wnl, neg hpv  History of abnormal Pap:  no MMG:  2023 normal. She has scheduled for next week  BMD:   none  Colonoscopy: none Cologuard 10/10/20 negative with her primary care provider.   TDaP:  2019 Gardasil: n/a   reports that she has never smoked. She has never used smokeless tobacco. She reports current alcohol use. She reports that she does not use drugs. Just occasional ETOH. 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 She has a 14 year old daughter and 2 year old son.   Past Medical History:  Diagnosis Date   Complication of anesthesia    bp drops a little, did ok with 2016 surgery   GERD (gastroesophageal reflux disease)    History of COVID-19    2021 and 2022   History of gastroesophageal reflux (GERD)    Nephrolithiasis    BILATERAL   Wears contact lenses     Past Surgical History:  Procedure Laterality Date   CYSTOSCOPY WITH RETROGRADE PYELOGRAM, URETEROSCOPY AND STENT PLACEMENT Bilateral 06/08/2014   Procedure: CYSTOSCOPY WITH BILATERAL RETROGRADE PYELOGRAM/ BILATERAL URETERAL STENT PLACEMENT/ BILATERAL URETEROSCOPY, LASER LITHOTRIPSY/STONE BASKETRY;   Surgeon: Alexis Frock, MD;  Location: [facility];  Service: Urology;  Laterality: Bilateral;   CYSTOSCOPY WITH RETROGRADE PYELOGRAM, URETEROSCOPY AND STENT PLACEMENT Left 08/23/2021   Procedure: CYSTOSCOPY WITH RETROGRADE PYELOGRAM, URETEROSCOPY AND STENT PLACEMENT;  Surgeon: Alexis Frock, MD;  Location: [facility];  Service: Urology;  Laterality: Left;  1 HR   dental implants  2018   ENDOMETRIAL ABLATION  2018   for fibroids   EXCISION CYST MANDIBLE  03/25/1987   HOLMIUM LASER APPLICATION Left [id number]   Procedure: HOLMIUM LASER APPLICATION;  Surgeon: Alexis Frock, MD;  Location[facility];  Service: Urology;  Laterality: Left;   HOLMIUM LASER APPLICATION Left [id number]   Procedure: HOLMIUM LASER APPLICATION;  Surgeon: Alexis Frock, MD;  Location[facility];  Service: Urology;  Laterality: Left;   NASAL FRACTURE SURGERY  03/24/2000   TONSILLECTOMY  03/24/2006   TUBAL LIGATION  03/24/2002   PPTL   vulvar cyst removed     10 to 15 yrs ago per pt on 08-22-2021    Current Outpatient Medications  Medication Sig Dispense Refill   AZO-CRANBERRY PO Take 2 tablets by mouth daily.     Esomeprazole Magnesium (NEXIUM PO) Take by mouth as needed.     indapamide (LOZOL) 1.25 MG tablet Take 0.625 mg by mouth daily.     loratadine (CLARITIN) 10 MG tablet Take 5 mg by mouth daily.     OVER THE  COUNTER MEDICATION Plant based omega  juice plus 2 tabs daily     OVER THE COUNTER MEDICATION Osteo biflex 2 tabs daily     Probiotic Product (PROBIOTIC DAILY PO) Take 1 tablet by mouth daily.     No current facility-administered medications for this visit.    Family History  Problem Relation Age of Onset   Breast cancer Maternal Grandmother     Review of Systems  All other systems reviewed and are negative.   Exam:   BP 110/60   Pulse 66   Ht 5' 8"$  (1.727 m)   Wt 163 lb (73.9 kg)   LMP 04/09/2022   SpO2 100%   BMI 24.78  kg/m   Weight change: @WEIGHTCHANGE$ @ Height:   Height: 5' 8"$  (172.7 cm)  Ht Readings from Last 3 Encounters:  05/07/22 5' 8"$  (1.727 m)  08/23/21 5' 8"$  (1.727 m)  12/08/17 5' 8"$  (1.727 m)    General appearance: alert, cooperative and appears stated age Head: Normocephalic, without obvious abnormality, atraumatic Neck: no adenopathy, supple, symmetrical, trachea midline and thyroid normal to inspection and palpation Lungs: clear to auscultation bilaterally Cardiovascular: regular rate and rhythm Breasts: normal appearance, no masses or tenderness, right nipple inverted (long term) Abdomen: soft, non-tender; non distended,  no masses,  no organomegaly Extremities: extremities normal, atraumatic, no cyanosis or edema Skin: Skin color, texture, turgor normal. No rashes or lesions Lymph nodes: Cervical, supraclavicular, and axillary nodes normal. No abnormal inguinal nodes palpated Neurologic: Grossly normal   Pelvic: External genitalia:  no lesions              Urethra:  normal appearing urethra with no masses, tenderness or lesions              Bartholins and Skenes: normal                 Vagina: normal appearing vagina with normal color and a slight increase in thick, white vaginal discharge, no lesions              Cervix: no lesions               Bimanual Exam:  Uterus:  normal size, contour, position, consistency, mobility, non-tender              Adnexa: no mass, fullness, tenderness               Rectovaginal: Confirms               Anus:  normal sphincter tone, no lesions  Gae Dry, CMA chaperoned for the exam.  1. Well woman exam Discussed breast self exam Discussed calcium and vit D intake Mammogram scheduled Cologuard UTD with her primary Labs with primary No pap this year  2. Vulvovaginitis -Vulvar skin care reviewed, information given - WET PREP FOR TRICH, YEAST, CLUE: negative - betamethasone valerate ointment (VALISONE) 0.1 %; Apply 1 Application topically  2 (two) times daily. Use for up to 2 weeks as needed  Dispense: 30 g; Refill: 0

## 2022-05-07 NOTE — Patient Instructions (Signed)

## 2022-05-15 ENCOUNTER — Ambulatory Visit
Admission: RE | Admit: 2022-05-15 | Discharge: 2022-05-15 | Disposition: A | Discharge status: Home / Self Care | Payer: Managed Care, Other (non HMO) | Source: Ambulatory Visit | Attending: Obstetrics and Gynecology | Admitting: Obstetrics and Gynecology

## 2022-05-15 DIAGNOSIS — Z1231 Encounter for screening mammogram for malignant neoplasm of breast: Secondary | ICD-10-CM

## 2023-02-16 ENCOUNTER — Encounter: Payer: Self-pay | Admitting: Orthopedic Surgery

## 2023-02-16 ENCOUNTER — Other Ambulatory Visit: Payer: Self-pay

## 2023-02-16 ENCOUNTER — Other Ambulatory Visit (INDEPENDENT_AMBULATORY_CARE_PROVIDER_SITE_OTHER): Payer: Managed Care, Other (non HMO)

## 2023-02-16 ENCOUNTER — Ambulatory Visit (INDEPENDENT_AMBULATORY_CARE_PROVIDER_SITE_OTHER): Payer: Managed Care, Other (non HMO) | Admitting: Orthopedic Surgery

## 2023-02-16 DIAGNOSIS — M25571 Pain in right ankle and joints of right foot: Secondary | ICD-10-CM

## 2023-02-17 ENCOUNTER — Encounter: Payer: Self-pay | Admitting: Orthopedic Surgery

## 2023-02-17 NOTE — Progress Notes (Signed)
Office Visit Note   Patient: Theresa Solis           Date of Birth: 08-14-1970           MRN: [id number] Visit Date: 02/16/2023 Requested by: Georgianne Fick, MD [address] [place],  [place] [address] PCP: Georgianne Fick, MD  Subjective: Chief Complaint  Patient presents with   Right Ankle - Pain    HPI: Theresa Solis is a 52 y.o. female who presents to the office reporting right ankle pain.  Patient reports a knot on the medial side of the right ankle which is growing in size over the past several months.  Patient states that she feels like this is not is on a nerve because it burns in the saphenous distribution.  Also radiates across the anterior aspect of her ankle.  Denies a history of penetrating trauma to the ankle in that region.  Tried over-the-counter medication without much relief..                ROS: All systems reviewed are negative as they relate to the chief complaint within the history of present illness.  Patient denies fevers or chills.  Assessment & Plan: Visit Diagnoses:  1. Pain in right ankle and joints of right foot     Plan: Impression is cystic versus noncystic 1/2 cm mass over the medial malleolus just anterior to the saphenous vein.  This could be ankle cyst with long stalk versus some type of schwannoma around that saphenous nerve branch.  Look better with ultrasound today and could not make a definitive determination about solid versus cystic nature of the mass.  It is mobile with equivocal Tinel's but does not really feel like a lipoma although that is a possibility.  Follow-up after MRI with contrast of that right ankle and we would likely consider excision of this mass depending on its imaging characteristics. Follow-Up Instructions: No follow-ups on file.   Orders:  Orders Placed This Encounter  Procedures   XR Ankle Complete Right   US Guided Needle Placement - No Linked Charges   MR Ankle Right w/ contrast   No  orders of the defined types were placed in this encounter.     Procedures: No procedures performed   Clinical Data: No additional findings.  Objective: Vital Signs: There were no vitals taken for this visit.  Physical Exam:  Constitutional: Patient appears well-developed HEENT:  Head: Normocephalic Eyes:EOM are normal Neck: Normal range of motion Cardiovascular: Normal rate Pulmonary/chest: Effort normal Neurologic: Patient is alert Skin: Skin is warm Psychiatric: Patient has normal mood and affect  Ortho Exam: Ortho exam demonstrates full range of motion tibiotalar subtalar transverse tarsal motion in the right ankle versus left.  Palpable intact nontender anterior to posterior to peroneal and Achilles tendons.  She does have a 1-1/2 and 1/2 cm mass just anterior to the saphenous vein over the medial malleolus.  Slightly tender to palpation with no proximal lymphadenopathy.  No induration erythema around this mobile mass.  Specialty Comments:  No specialty comments available.  Imaging: No results found.   PMFS History: Patient Active Problem List   Diagnosis Date Noted   Kidney stone 06/22/2021   Past Medical History:  Diagnosis Date   Complication of anesthesia    bp drops a little, did ok with 2016 surgery   GERD (gastroesophageal reflux disease)    History of COVID-19    2021 and 2022   History of gastroesophageal  reflux (GERD)    Nephrolithiasis    BILATERAL   Wears contact lenses     Family History  Problem Relation Age of Onset   Breast cancer Maternal Grandmother     Past Surgical History:  Procedure Laterality Date   CYSTOSCOPY WITH RETROGRADE PYELOGRAM, URETEROSCOPY AND STENT PLACEMENT Bilateral 06/08/2014   Procedure: CYSTOSCOPY WITH BILATERAL RETROGRADE PYELOGRAM/ BILATERAL URETERAL STENT PLACEMENT/ BILATERAL URETEROSCOPY, LASER LITHOTRIPSY/STONE BASKETRY;  Surgeon: Sebastian Ache, MD;  Location: [facility];  Service: Urology;   Laterality: Bilateral;   CYSTOSCOPY WITH RETROGRADE PYELOGRAM, URETEROSCOPY AND STENT PLACEMENT Left 08/23/2021   Procedure: CYSTOSCOPY WITH RETROGRADE PYELOGRAM, URETEROSCOPY AND STENT PLACEMENT;  Surgeon: Sebastian Ache, MD;  Location: [facility];  Service: Urology;  Laterality: Left;  1 HR   dental implants  2018   ENDOMETRIAL ABLATION  2018   for fibroids   EXCISION CYST MANDIBLE  03/25/1987   HOLMIUM LASER APPLICATION Left 06/08/2014   Procedure: HOLMIUM LASER APPLICATION;  Surgeon: Sebastian Ache, MD;  Location: [facility];  Service: Urology;  Laterality: Left;   HOLMIUM LASER APPLICATION Left 08/23/2021   Procedure: HOLMIUM LASER APPLICATION;  Surgeon: Sebastian Ache, MD;  Location: [facility];  Service: Urology;  Laterality: Left;   NASAL FRACTURE SURGERY  03/24/2000   TONSILLECTOMY  03/24/2006   TUBAL LIGATION  03/24/2002   [facility]   vulvar cyst removed     10 to 15 yrs ago per pt on 08-22-2021   Social History   Occupational History   Not on file  Tobacco Use   Smoking status: Never   Smokeless tobacco: Never  Vaping Use   Vaping status: Never Used  Substance and Sexual Activity   Alcohol use: Yes    Comment: occ   Drug use: No   Sexual activity: Yes    Partners: Male    Birth control/protection: Surgical

## 2023-02-18 ENCOUNTER — Telehealth: Payer: Self-pay | Admitting: Orthopedic Surgery

## 2023-02-18 NOTE — Telephone Encounter (Signed)
Did he want arthogram? If not then it needs to be with and without contrast if evaluating mass etc. I can change the order just let me know what is needed.

## 2023-02-18 NOTE — Telephone Encounter (Signed)
This would just be MRI with contrast not an arthrogram.  Thanks

## 2023-02-18 NOTE — Telephone Encounter (Signed)
Patient called because she said that [facility] have tried to contact us regarding the order for the MRI. It said with or without contrast and they said it has to be one of them. They can't schedule her until dean fix the order. CB#[phone]

## 2023-02-23 ENCOUNTER — Encounter: Payer: Self-pay | Admitting: Orthopedic Surgery

## 2023-02-23 ENCOUNTER — Other Ambulatory Visit (HOSPITAL_BASED_OUTPATIENT_CLINIC_OR_DEPARTMENT_OTHER): Payer: Self-pay

## 2023-02-23 DIAGNOSIS — M25571 Pain in right ankle and joints of right foot: Secondary | ICD-10-CM

## 2023-02-23 NOTE — Telephone Encounter (Signed)
Per last notes I changed the MRI order but [facility] imaging said that they can't do the MRI until jan. Pt wants to get it done before the end of the year. Can we get her in anywhere else?

## 2023-03-16 ENCOUNTER — Ambulatory Visit
Admission: RE | Admit: 2023-03-16 | Discharge: 2023-03-16 | Disposition: A | Discharge status: Home / Self Care | Payer: Managed Care, Other (non HMO) | Source: Ambulatory Visit | Attending: Orthopedic Surgery | Admitting: Orthopedic Surgery

## 2023-03-16 DIAGNOSIS — M25571 Pain in right ankle and joints of right foot: Secondary | ICD-10-CM

## 2023-03-16 MED ORDER — GADOPICLENOL 0.5 MMOL/ML IV SOLN
7.5000 mL | Freq: Once | INTRAVENOUS | Status: AC | PRN
  Administered 2023-03-16: 7.5 mL via INTRAVENOUS

## 2023-03-19 ENCOUNTER — Telehealth: Payer: Self-pay | Admitting: Orthopedic Surgery

## 2023-03-19 NOTE — Telephone Encounter (Signed)
Theresa Solis had an appointment scheduled for tomorrow to review the results of her MRI.  She had to cancel due to sickness.  She is not feeling well and her family has tested positive for COVID.  Can we call her with the results?  [phone].

## 2023-03-20 ENCOUNTER — Ambulatory Visit: Payer: Managed Care, Other (non HMO) | Admitting: Orthopedic Surgery

## 2023-03-20 NOTE — Telephone Encounter (Signed)
I called Theresa Solis and reviewed results with her.  On my review there is nothing discrete that needs excision.  She is not too symptomatic with this at this point anyway.  No definite cyst is present.  We are going to observe for now.

## 2024-01-25 ENCOUNTER — Encounter: Payer: Self-pay | Admitting: Radiology

## 2024-03-15 ENCOUNTER — Ambulatory Visit: Admitting: Podiatry

## 2024-03-15 DIAGNOSIS — M21961 Unspecified acquired deformity of right lower leg: Secondary | ICD-10-CM

## 2024-03-15 DIAGNOSIS — M21962 Unspecified acquired deformity of left lower leg: Secondary | ICD-10-CM

## 2024-03-16 NOTE — Progress Notes (Signed)
 "  Subjective:  Patient ID: Theresa Solis, female    DOB: 09/26/1970,  MRN: [id number]  Chief Complaint  Patient presents with   Foot Orthotics    53 y.o. female presents with the above complaint.  Patient presents with semiflexible flatfoot deformity.  She states that she is doing good denies any other acute complaints.  She states she does not have any discomfort.  However she wanted to get Rx.  She states that she needs a little bit supporting her feet denies any other acute complaints   Review of Systems: Negative except as noted in the HPI. Denies N/V/F/Ch.  Past Medical History:  Diagnosis Date   Complication of anesthesia    bp drops a little, did ok with 2016 surgery   GERD (gastroesophageal reflux disease)    History of COVID-19    2021 and 2022   History of gastroesophageal reflux (GERD)    Nephrolithiasis    BILATERAL   Wears contact lenses    Current Medications[1]  Tobacco Use History[2]  Allergies[3] Objective:  There were no vitals filed for this visit. There is no height or weight on file to calculate BMI. Constitutional Well developed. Well nourished.  Vascular Dorsalis pedis pulses palpable bilaterally. Posterior tibial pulses palpable bilaterally. Capillary refill normal to all digits.  No cyanosis or clubbing noted. Pedal hair growth normal.  Neurologic Normal speech. Oriented to person, place, and time. Epicritic sensation to light touch grossly present bilaterally.  Dermatologic Nails well groomed and normal in appearance. No open wounds. No skin lesions.  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   Radiographs: None Assessment:   1. Foot deformity, bilateral    Plan:  Patient was evaluated and treated and all questions answered.  Pes planovalgus/foot deformity7  flexible -I explained to patient the etiology of pes planovalgus and relationship with heel pain/arch pain and various treatment options were discussed.  Given patient foot structure in the setting of heel pain/arch pain I believe patient will benefit from custom-made orthotics to help control the hindfoot motion support the arch of the foot and take the stress away from arches.  Patient agrees with the plan like to proceed with orthotics -Patient was casted for orthotics  -  No follow-ups on file.     [1]  Current Outpatient Medications:    AZO-CRANBERRY PO, Take 2 tablets by mouth daily., Disp: , Rfl:    betamethasone  valerate ointment (VALISONE ) 0.1 %, Apply 1 Application topically 2 (two) times daily. Use for up to 2 weeks as needed, Disp: 30 g, Rfl: 0   Esomeprazole Magnesium (NEXIUM PO), Take by mouth as needed., Disp: , Rfl:    indapamide (LOZOL) 1.25 MG tablet, Take 0.625 mg by mouth daily., Disp: , Rfl:    loratadine (CLARITIN) 10 MG tablet, Take 5 mg by mouth daily., Disp: , Rfl:    OVER THE COUNTER MEDICATION, Plant based omega  juice plus 2 tabs daily, Disp: , Rfl:    OVER THE COUNTER MEDICATION, Osteo biflex 2 tabs daily, Disp: , Rfl:    Probiotic Product (PROBIOTIC DAILY PO), Take 1 tablet by mouth daily., Disp: , Rfl:  [2]  Social History Tobacco Use  Smoking Status Never  Smokeless Tobacco Never  [3]  Allergies Allergen Reactions   Codeine Nausea And Vomiting   Tetracyclines & Related Nausea And  Vomiting   "

## 2024-04-08 ENCOUNTER — Telehealth: Payer: Self-pay | Admitting: Podiatry

## 2024-04-08 NOTE — Telephone Encounter (Signed)
 Orthotics are in the [place] office. Spoke to Devol; set up appt for 04/15/24 at 3:15PM to [facility]. Orthotics are being sent to the [facility] location today. Adelita asked to keep an eye out for her husbands & sons orthotics Hezzie and Zylah Elsbernd) they all ordered them together and would like to pick them up together if possible. Advised: no one has had orthotics before so they will each have to be scheduled an appt for picking up the orthotics. If I come across them, I will keep them together & call back to try to set them up an appt time close to hers to [facility].

## 2024-04-15 ENCOUNTER — Encounter
